# Patient Record
Sex: Female | Born: 1945 | Race: White | Hispanic: No | Marital: Married | Smoking: Former smoker
Health system: Southern US, Community
[De-identification: ages and names within clinical notes are randomized; demographics above are authoritative.]

## PROBLEM LIST (undated history)

## (undated) DIAGNOSIS — Z86010 Personal history of colonic polyps: Secondary | ICD-10-CM

## (undated) DIAGNOSIS — I7300 Raynaud's syndrome without gangrene: Secondary | ICD-10-CM

## (undated) DIAGNOSIS — D126 Benign neoplasm of colon, unspecified: Secondary | ICD-10-CM

## (undated) DIAGNOSIS — I10 Essential (primary) hypertension: Secondary | ICD-10-CM

## (undated) DIAGNOSIS — L930 Discoid lupus erythematosus: Secondary | ICD-10-CM

## (undated) DIAGNOSIS — K219 Gastro-esophageal reflux disease without esophagitis: Secondary | ICD-10-CM

## (undated) DIAGNOSIS — M1990 Unspecified osteoarthritis, unspecified site: Secondary | ICD-10-CM

## (undated) DIAGNOSIS — K589 Irritable bowel syndrome without diarrhea: Secondary | ICD-10-CM

## (undated) DIAGNOSIS — I8390 Asymptomatic varicose veins of unspecified lower extremity: Secondary | ICD-10-CM

## (undated) DIAGNOSIS — T7840XA Allergy, unspecified, initial encounter: Secondary | ICD-10-CM

## (undated) HISTORY — PX: TONSILLECTOMY: SUR1361

## (undated) HISTORY — DX: Raynaud's syndrome without gangrene: I73.00

## (undated) HISTORY — PX: COLONOSCOPY: SHX174

## (undated) HISTORY — DX: Personal history of colonic polyps: Z86.010

## (undated) HISTORY — DX: Gastro-esophageal reflux disease without esophagitis: K21.9

## (undated) HISTORY — DX: Benign neoplasm of colon, unspecified: D12.6

## (undated) HISTORY — DX: Allergy, unspecified, initial encounter: T78.40XA

## (undated) HISTORY — DX: Irritable bowel syndrome without diarrhea: K58.9

## (undated) HISTORY — DX: Essential (primary) hypertension: I10

## (undated) HISTORY — DX: Discoid lupus erythematosus: L93.0

## (undated) HISTORY — DX: Unspecified osteoarthritis, unspecified site: M19.90

## (undated) HISTORY — PX: CHOLECYSTECTOMY: SHX55

## (undated) HISTORY — DX: Asymptomatic varicose veins of unspecified lower extremity: I83.90

---

## 1998-10-04 ENCOUNTER — Other Ambulatory Visit: Admission: RE | Admit: 1998-10-04 | Discharge: 1998-10-04 | Payer: Self-pay | Admitting: Obstetrics & Gynecology

## 1999-04-24 ENCOUNTER — Other Ambulatory Visit: Admission: RE | Admit: 1999-04-24 | Discharge: 1999-04-24 | Payer: Self-pay | Admitting: Obstetrics & Gynecology

## 1999-12-02 ENCOUNTER — Other Ambulatory Visit: Admission: RE | Admit: 1999-12-02 | Discharge: 1999-12-02 | Payer: Self-pay | Admitting: Obstetrics & Gynecology

## 2001-04-29 ENCOUNTER — Other Ambulatory Visit: Admission: RE | Admit: 2001-04-29 | Discharge: 2001-04-29 | Payer: Self-pay | Admitting: Obstetrics & Gynecology

## 2002-06-09 ENCOUNTER — Other Ambulatory Visit: Admission: RE | Admit: 2002-06-09 | Discharge: 2002-06-09 | Payer: Self-pay | Admitting: Obstetrics & Gynecology

## 2002-08-16 ENCOUNTER — Encounter: Admission: RE | Admit: 2002-08-16 | Discharge: 2002-11-14 | Payer: Self-pay | Admitting: Internal Medicine

## 2003-05-03 ENCOUNTER — Encounter: Payer: Self-pay | Admitting: Internal Medicine

## 2003-10-11 ENCOUNTER — Other Ambulatory Visit: Admission: RE | Admit: 2003-10-11 | Discharge: 2003-10-11 | Payer: Self-pay | Admitting: Obstetrics & Gynecology

## 2003-10-26 ENCOUNTER — Encounter: Payer: Self-pay | Admitting: Internal Medicine

## 2003-10-26 DIAGNOSIS — D126 Benign neoplasm of colon, unspecified: Secondary | ICD-10-CM

## 2003-10-26 HISTORY — DX: Benign neoplasm of colon, unspecified: D12.6

## 2003-11-15 ENCOUNTER — Ambulatory Visit: Payer: Self-pay | Admitting: Internal Medicine

## 2003-11-22 ENCOUNTER — Ambulatory Visit: Payer: Self-pay | Admitting: Internal Medicine

## 2003-12-15 ENCOUNTER — Ambulatory Visit: Payer: Self-pay | Admitting: Internal Medicine

## 2003-12-28 ENCOUNTER — Ambulatory Visit: Payer: Self-pay | Admitting: Internal Medicine

## 2004-02-01 ENCOUNTER — Ambulatory Visit: Payer: Self-pay | Admitting: Internal Medicine

## 2004-03-06 ENCOUNTER — Ambulatory Visit: Payer: Self-pay | Admitting: Internal Medicine

## 2004-03-26 ENCOUNTER — Other Ambulatory Visit: Admission: RE | Admit: 2004-03-26 | Discharge: 2004-03-26 | Payer: Self-pay | Admitting: Obstetrics & Gynecology

## 2004-05-22 ENCOUNTER — Ambulatory Visit: Payer: Self-pay | Admitting: Internal Medicine

## 2004-11-25 ENCOUNTER — Ambulatory Visit: Payer: Self-pay | Admitting: Internal Medicine

## 2004-12-23 ENCOUNTER — Ambulatory Visit: Payer: Self-pay | Admitting: Internal Medicine

## 2005-01-29 ENCOUNTER — Ambulatory Visit: Payer: Self-pay | Admitting: Internal Medicine

## 2005-03-24 ENCOUNTER — Ambulatory Visit: Payer: Self-pay | Admitting: Internal Medicine

## 2005-03-31 ENCOUNTER — Ambulatory Visit: Payer: Self-pay | Admitting: Internal Medicine

## 2005-04-29 ENCOUNTER — Ambulatory Visit: Payer: Self-pay | Admitting: Internal Medicine

## 2005-05-13 ENCOUNTER — Ambulatory Visit: Payer: Self-pay | Admitting: Internal Medicine

## 2005-05-20 ENCOUNTER — Ambulatory Visit: Payer: Self-pay | Admitting: Internal Medicine

## 2005-05-29 ENCOUNTER — Ambulatory Visit: Payer: Self-pay | Admitting: Internal Medicine

## 2005-07-02 ENCOUNTER — Ambulatory Visit: Payer: Self-pay | Admitting: Internal Medicine

## 2005-08-01 ENCOUNTER — Ambulatory Visit: Payer: Self-pay | Admitting: Internal Medicine

## 2005-08-26 ENCOUNTER — Ambulatory Visit: Payer: Self-pay | Admitting: Internal Medicine

## 2005-10-01 ENCOUNTER — Ambulatory Visit: Payer: Self-pay | Admitting: Internal Medicine

## 2005-10-28 ENCOUNTER — Ambulatory Visit: Payer: Self-pay | Admitting: Internal Medicine

## 2005-11-26 ENCOUNTER — Ambulatory Visit: Payer: Self-pay | Admitting: Internal Medicine

## 2006-01-26 ENCOUNTER — Ambulatory Visit: Payer: Self-pay | Admitting: Internal Medicine

## 2006-01-26 LAB — CONVERTED CEMR LAB
Albumin: 3.5 g/dL (ref 3.5–5.2)
Alkaline Phosphatase: 50 units/L (ref 39–117)
Hemoglobin: 14.4 g/dL (ref 12.0–15.0)
MCHC: 34.8 g/dL (ref 30.0–36.0)
Monocytes Absolute: 0.5 10*3/uL (ref 0.2–0.7)
Monocytes Relative: 6.4 % (ref 3.0–11.0)
Neutro Abs: 5 10*3/uL (ref 1.4–7.7)
RDW: 11.6 % (ref 11.5–14.6)
Total Bilirubin: 0.8 mg/dL (ref 0.3–1.2)

## 2006-02-27 ENCOUNTER — Ambulatory Visit: Payer: Self-pay | Admitting: Internal Medicine

## 2006-03-26 ENCOUNTER — Ambulatory Visit: Payer: Self-pay | Admitting: Internal Medicine

## 2006-06-16 ENCOUNTER — Ambulatory Visit: Payer: Self-pay | Admitting: Internal Medicine

## 2006-07-07 DIAGNOSIS — K589 Irritable bowel syndrome without diarrhea: Secondary | ICD-10-CM

## 2006-07-07 DIAGNOSIS — K219 Gastro-esophageal reflux disease without esophagitis: Secondary | ICD-10-CM

## 2006-07-07 DIAGNOSIS — I10 Essential (primary) hypertension: Secondary | ICD-10-CM

## 2006-07-07 DIAGNOSIS — M1990 Unspecified osteoarthritis, unspecified site: Secondary | ICD-10-CM

## 2006-07-07 DIAGNOSIS — J309 Allergic rhinitis, unspecified: Secondary | ICD-10-CM | POA: Insufficient documentation

## 2006-07-07 HISTORY — DX: Unspecified osteoarthritis, unspecified site: M19.90

## 2006-07-07 HISTORY — DX: Irritable bowel syndrome, unspecified: K58.9

## 2006-07-20 ENCOUNTER — Ambulatory Visit: Payer: Self-pay | Admitting: Internal Medicine

## 2006-08-26 ENCOUNTER — Ambulatory Visit: Payer: Self-pay | Admitting: Internal Medicine

## 2006-08-26 DIAGNOSIS — D518 Other vitamin B12 deficiency anemias: Secondary | ICD-10-CM

## 2006-08-26 DIAGNOSIS — M810 Age-related osteoporosis without current pathological fracture: Secondary | ICD-10-CM | POA: Insufficient documentation

## 2006-08-26 DIAGNOSIS — L930 Discoid lupus erythematosus: Secondary | ICD-10-CM | POA: Insufficient documentation

## 2006-09-21 ENCOUNTER — Ambulatory Visit: Payer: Self-pay | Admitting: Internal Medicine

## 2006-09-21 LAB — CONVERTED CEMR LAB
AST: 17 units/L (ref 0–37)
Albumin: 3.3 g/dL — ABNORMAL LOW (ref 3.5–5.2)
Alkaline Phosphatase: 50 units/L (ref 39–117)
Anti Nuclear Antibody(ANA): POSITIVE — AB
BUN: 11 mg/dL (ref 6–23)
Basophils Absolute: 0 10*3/uL (ref 0.0–0.1)
Bilirubin Urine: NEGATIVE
Blood in Urine, dipstick: NEGATIVE
Chloride: 113 meq/L — ABNORMAL HIGH (ref 96–112)
Cholesterol: 184 mg/dL (ref 0–200)
Creatinine, Ser: 0.7 mg/dL (ref 0.4–1.2)
Eosinophils Absolute: 0.1 10*3/uL (ref 0.0–0.6)
GFR calc non Af Amer: 90 mL/min
HDL: 55.2 mg/dL (ref >39.0)
Ketones, urine, test strip: NEGATIVE
LDL Cholesterol: 120 mg/dL — ABNORMAL HIGH (ref 0–99)
MCHC: 35.5 g/dL (ref 30.0–36.0)
Monocytes Relative: 5.9 % (ref 3.0–11.0)
Potassium: 4.5 meq/L (ref 3.5–5.1)
Protein, U semiquant: NEGATIVE
RBC: 4.2 M/uL (ref 3.87–5.11)
RDW: 11.8 % (ref 11.5–14.6)
Total Bilirubin: 0.7 mg/dL (ref 0.3–1.2)
Total CHOL/HDL Ratio: 3.3
Triglycerides: 46 mg/dL (ref 0–149)
Urobilinogen, UA: 0.2

## 2006-09-29 ENCOUNTER — Ambulatory Visit: Payer: Self-pay | Admitting: Internal Medicine

## 2006-09-29 DIAGNOSIS — I8390 Asymptomatic varicose veins of unspecified lower extremity: Secondary | ICD-10-CM

## 2006-09-29 HISTORY — DX: Asymptomatic varicose veins of unspecified lower extremity: I83.90

## 2006-11-03 ENCOUNTER — Ambulatory Visit: Payer: Self-pay | Admitting: Internal Medicine

## 2006-11-30 ENCOUNTER — Ambulatory Visit: Payer: Self-pay | Admitting: Internal Medicine

## 2006-12-29 ENCOUNTER — Ambulatory Visit: Payer: Self-pay | Admitting: Internal Medicine

## 2007-01-26 ENCOUNTER — Ambulatory Visit: Payer: Self-pay | Admitting: Internal Medicine

## 2007-01-26 DIAGNOSIS — M67919 Unspecified disorder of synovium and tendon, unspecified shoulder: Secondary | ICD-10-CM | POA: Insufficient documentation

## 2007-01-26 DIAGNOSIS — M719 Bursopathy, unspecified: Secondary | ICD-10-CM

## 2007-01-26 LAB — CONVERTED CEMR LAB
ALT: 16 units/L (ref 0–35)
AST: 18 units/L (ref 0–37)
Alkaline Phosphatase: 51 units/L (ref 39–117)
Basophils Relative: 1.5 % — ABNORMAL HIGH (ref 0.0–1.0)
Bilirubin, Direct: 0.2 mg/dL (ref 0.0–0.3)
Eosinophils Absolute: 0.1 10*3/uL (ref 0.0–0.6)
Eosinophils Relative: 1.3 % (ref 0.0–5.0)
HCT: 41 % (ref 36.0–46.0)
MCV: 95.5 fL (ref 78.0–100.0)
Neutrophils Relative %: 70.4 % (ref 43.0–77.0)
Platelets: 201 10*3/uL (ref 150–400)
RBC: 4.29 M/uL (ref 3.87–5.11)
Total Bilirubin: 0.7 mg/dL (ref 0.3–1.2)
Total Protein: 7.4 g/dL (ref 6.0–8.3)
WBC: 6.5 10*3/uL (ref 4.5–10.5)

## 2007-03-04 ENCOUNTER — Ambulatory Visit: Payer: Self-pay | Admitting: Internal Medicine

## 2007-03-04 DIAGNOSIS — B373 Candidiasis of vulva and vagina: Secondary | ICD-10-CM

## 2007-04-21 ENCOUNTER — Ambulatory Visit: Payer: Self-pay | Admitting: Internal Medicine

## 2007-06-02 ENCOUNTER — Telehealth: Payer: Self-pay | Admitting: Internal Medicine

## 2007-07-05 ENCOUNTER — Ambulatory Visit: Payer: Self-pay | Admitting: Internal Medicine

## 2007-07-14 LAB — CONVERTED CEMR LAB: Pap Smear: NORMAL

## 2007-08-02 ENCOUNTER — Ambulatory Visit: Payer: Self-pay | Admitting: Internal Medicine

## 2007-10-04 ENCOUNTER — Ambulatory Visit: Payer: Self-pay | Admitting: Internal Medicine

## 2007-11-08 ENCOUNTER — Ambulatory Visit: Payer: Self-pay | Admitting: Internal Medicine

## 2007-11-25 ENCOUNTER — Ambulatory Visit: Payer: Self-pay | Admitting: Internal Medicine

## 2007-11-25 LAB — CONVERTED CEMR LAB
ALT: 16 units/L (ref 0–35)
Albumin: 3.4 g/dL — ABNORMAL LOW (ref 3.5–5.2)
Alkaline Phosphatase: 52 units/L (ref 39–117)
BUN: 8 mg/dL (ref 6–23)
Bilirubin, Direct: 0.2 mg/dL (ref 0.0–0.3)
CO2: 29 meq/L (ref 19–32)
Calcium: 9 mg/dL (ref 8.4–10.5)
Eosinophils Relative: 2.1 % (ref 0.0–5.0)
GFR calc Af Amer: 93 mL/min
Glucose, Bld: 87 mg/dL (ref 70–99)
HCT: 39.8 % (ref 36.0–46.0)
Hemoglobin: 14 g/dL (ref 12.0–15.0)
LDL Cholesterol: 114 mg/dL — ABNORMAL HIGH (ref 0–99)
Lymphocytes Relative: 30.3 % (ref 12.0–46.0)
Monocytes Absolute: 0.3 10*3/uL (ref 0.1–1.0)
Monocytes Relative: 8.3 % (ref 3.0–12.0)
Neutro Abs: 2.2 10*3/uL (ref 1.4–7.7)
Nitrite: NEGATIVE
Potassium: 4.4 meq/L (ref 3.5–5.1)
RDW: 11.7 % (ref 11.5–14.6)
Sodium: 140 meq/L (ref 135–145)
Specific Gravity, Urine: 1.02
Total CHOL/HDL Ratio: 3
Total Protein: 7.2 g/dL (ref 6.0–8.3)
Triglycerides: 50 mg/dL (ref 0–149)
Urobilinogen, UA: 0.2
WBC Urine, dipstick: NEGATIVE
WBC: 3.7 10*3/uL — ABNORMAL LOW (ref 4.5–10.5)

## 2007-12-02 ENCOUNTER — Ambulatory Visit: Payer: Self-pay | Admitting: Internal Medicine

## 2007-12-30 ENCOUNTER — Ambulatory Visit: Payer: Self-pay | Admitting: Internal Medicine

## 2007-12-31 ENCOUNTER — Telehealth: Payer: Self-pay | Admitting: Internal Medicine

## 2008-02-01 ENCOUNTER — Telehealth: Payer: Self-pay | Admitting: Internal Medicine

## 2008-02-07 ENCOUNTER — Ambulatory Visit: Payer: Self-pay | Admitting: Internal Medicine

## 2008-02-07 DIAGNOSIS — K117 Disturbances of salivary secretion: Secondary | ICD-10-CM

## 2008-03-29 ENCOUNTER — Ambulatory Visit: Payer: Self-pay | Admitting: Internal Medicine

## 2008-04-05 ENCOUNTER — Encounter: Payer: Self-pay | Admitting: Internal Medicine

## 2008-04-18 ENCOUNTER — Encounter: Admission: RE | Admit: 2008-04-18 | Discharge: 2008-04-18 | Payer: Self-pay | Admitting: Internal Medicine

## 2008-04-26 ENCOUNTER — Telehealth: Payer: Self-pay | Admitting: Internal Medicine

## 2008-04-28 ENCOUNTER — Ambulatory Visit: Payer: Self-pay | Admitting: Internal Medicine

## 2008-05-03 ENCOUNTER — Encounter: Admission: RE | Admit: 2008-05-03 | Discharge: 2008-05-03 | Payer: Self-pay | Admitting: Internal Medicine

## 2008-05-08 ENCOUNTER — Telehealth: Payer: Self-pay | Admitting: Speech Pathology

## 2008-05-10 ENCOUNTER — Encounter (INDEPENDENT_AMBULATORY_CARE_PROVIDER_SITE_OTHER): Payer: Self-pay | Admitting: Interventional Radiology

## 2008-05-10 ENCOUNTER — Other Ambulatory Visit: Admission: RE | Admit: 2008-05-10 | Discharge: 2008-05-10 | Payer: Self-pay | Admitting: Interventional Radiology

## 2008-05-10 ENCOUNTER — Encounter: Admission: RE | Admit: 2008-05-10 | Discharge: 2008-05-10 | Payer: Self-pay | Admitting: Internal Medicine

## 2008-05-17 ENCOUNTER — Telehealth: Payer: Self-pay | Admitting: Internal Medicine

## 2008-06-06 ENCOUNTER — Ambulatory Visit: Payer: Self-pay | Admitting: Internal Medicine

## 2008-06-13 ENCOUNTER — Telehealth: Payer: Self-pay | Admitting: Internal Medicine

## 2008-07-31 ENCOUNTER — Ambulatory Visit: Payer: Self-pay | Admitting: Internal Medicine

## 2008-08-08 ENCOUNTER — Encounter: Admission: RE | Admit: 2008-08-08 | Discharge: 2008-08-08 | Payer: Self-pay | Admitting: Interventional Radiology

## 2008-09-05 ENCOUNTER — Ambulatory Visit: Payer: Self-pay | Admitting: Internal Medicine

## 2008-10-11 ENCOUNTER — Ambulatory Visit: Payer: Self-pay | Admitting: Internal Medicine

## 2008-11-07 ENCOUNTER — Encounter: Admission: RE | Admit: 2008-11-07 | Discharge: 2008-11-07 | Payer: Self-pay | Admitting: Interventional Radiology

## 2008-12-04 ENCOUNTER — Ambulatory Visit: Payer: Self-pay | Admitting: Internal Medicine

## 2008-12-20 ENCOUNTER — Encounter: Admission: RE | Admit: 2008-12-20 | Discharge: 2008-12-20 | Payer: Self-pay | Admitting: Interventional Radiology

## 2009-01-10 ENCOUNTER — Ambulatory Visit: Payer: Self-pay | Admitting: Internal Medicine

## 2009-01-22 ENCOUNTER — Ambulatory Visit: Payer: Self-pay | Admitting: Internal Medicine

## 2009-01-22 LAB — CONVERTED CEMR LAB
ALT: 16 units/L (ref 0–35)
Alkaline Phosphatase: 52 units/L (ref 39–117)
Anti Nuclear Antibody(ANA): POSITIVE — AB
BUN: 9 mg/dL (ref 6–23)
Bilirubin Urine: NEGATIVE
Bilirubin, Direct: 0 mg/dL (ref 0.0–0.3)
Blood in Urine, dipstick: NEGATIVE
Calcium: 9 mg/dL (ref 8.4–10.5)
Chloride: 106 meq/L (ref 96–112)
Cholesterol: 212 mg/dL — ABNORMAL HIGH (ref 0–200)
Creatinine, Ser: 0.9 mg/dL (ref 0.4–1.2)
Direct LDL: 125.6 mg/dL
Eosinophils Relative: 2.2 % (ref 0.0–5.0)
GFR calc non Af Amer: 66.78 mL/min (ref >60)
Glucose, Urine, Semiquant: NEGATIVE
HDL: 74.1 mg/dL (ref >39.00)
Lymphocytes Relative: 28.1 % (ref 12.0–46.0)
MCV: 95.5 fL (ref 78.0–100.0)
Monocytes Absolute: 0.4 10*3/uL (ref 0.1–1.0)
Neutrophils Relative %: 61.3 % (ref 43.0–77.0)
Platelets: 192 10*3/uL (ref 150.0–400.0)
Protein, U semiquant: NEGATIVE
Total Bilirubin: 0.7 mg/dL (ref 0.3–1.2)
Total CHOL/HDL Ratio: 3
Triglycerides: 53 mg/dL (ref 0.0–149.0)
Urobilinogen, UA: 0.2
VLDL: 10.6 mg/dL (ref 0.0–40.0)
WBC: 5 10*3/uL (ref 4.5–10.5)
pH: 7

## 2009-01-23 ENCOUNTER — Encounter: Admission: RE | Admit: 2009-01-23 | Discharge: 2009-01-23 | Payer: Self-pay | Admitting: Interventional Radiology

## 2009-01-29 ENCOUNTER — Ambulatory Visit: Payer: Self-pay | Admitting: Internal Medicine

## 2009-01-30 ENCOUNTER — Encounter: Admission: RE | Admit: 2009-01-30 | Discharge: 2009-01-30 | Payer: Self-pay | Admitting: Interventional Radiology

## 2009-02-07 ENCOUNTER — Ambulatory Visit: Payer: Self-pay | Admitting: Internal Medicine

## 2009-02-27 ENCOUNTER — Encounter: Admission: RE | Admit: 2009-02-27 | Discharge: 2009-02-27 | Payer: Self-pay | Admitting: Interventional Radiology

## 2009-03-06 ENCOUNTER — Ambulatory Visit: Payer: Self-pay | Admitting: Internal Medicine

## 2009-03-28 LAB — HM PAP SMEAR

## 2009-03-30 ENCOUNTER — Ambulatory Visit: Payer: Self-pay | Admitting: Internal Medicine

## 2009-04-30 ENCOUNTER — Ambulatory Visit: Payer: Self-pay | Admitting: Internal Medicine

## 2009-05-01 ENCOUNTER — Encounter (INDEPENDENT_AMBULATORY_CARE_PROVIDER_SITE_OTHER): Payer: Self-pay | Admitting: *Deleted

## 2009-05-09 ENCOUNTER — Telehealth: Payer: Self-pay | Admitting: Internal Medicine

## 2009-05-15 ENCOUNTER — Encounter (INDEPENDENT_AMBULATORY_CARE_PROVIDER_SITE_OTHER): Payer: Self-pay

## 2009-05-17 ENCOUNTER — Ambulatory Visit: Payer: Self-pay | Admitting: Internal Medicine

## 2009-05-21 DIAGNOSIS — Z860101 Personal history of adenomatous and serrated colon polyps: Secondary | ICD-10-CM

## 2009-05-21 DIAGNOSIS — Z86010 Personal history of colonic polyps: Secondary | ICD-10-CM

## 2009-05-21 HISTORY — DX: Personal history of colonic polyps: Z86.010

## 2009-05-21 HISTORY — DX: Personal history of adenomatous and serrated colon polyps: Z86.0101

## 2009-05-30 ENCOUNTER — Ambulatory Visit: Payer: Self-pay | Admitting: Internal Medicine

## 2009-05-31 ENCOUNTER — Ambulatory Visit: Payer: Self-pay | Admitting: Internal Medicine

## 2009-06-07 ENCOUNTER — Encounter: Payer: Self-pay | Admitting: Internal Medicine

## 2009-06-29 ENCOUNTER — Ambulatory Visit: Payer: Self-pay | Admitting: Internal Medicine

## 2009-07-10 ENCOUNTER — Ambulatory Visit: Payer: Self-pay | Admitting: Internal Medicine

## 2009-08-08 ENCOUNTER — Encounter: Admission: RE | Admit: 2009-08-08 | Discharge: 2009-08-08 | Payer: Self-pay | Admitting: Interventional Radiology

## 2009-08-08 ENCOUNTER — Ambulatory Visit: Payer: Self-pay | Admitting: Internal Medicine

## 2009-08-28 ENCOUNTER — Telehealth: Payer: Self-pay | Admitting: Internal Medicine

## 2009-09-14 ENCOUNTER — Ambulatory Visit: Payer: Self-pay | Admitting: Internal Medicine

## 2009-11-09 ENCOUNTER — Ambulatory Visit: Payer: Self-pay | Admitting: Internal Medicine

## 2009-11-09 LAB — CONVERTED CEMR LAB
BUN: 13 mg/dL (ref 6–23)
Calcium: 8.9 mg/dL (ref 8.4–10.5)
Chloride: 103 meq/L (ref 96–112)
Creatinine, Ser: 0.8 mg/dL (ref 0.4–1.2)
TSH: 1.27 microintl units/mL (ref 0.35–5.50)

## 2009-11-14 ENCOUNTER — Encounter: Admission: RE | Admit: 2009-11-14 | Discharge: 2009-11-14 | Payer: Self-pay | Admitting: Interventional Radiology

## 2009-12-14 ENCOUNTER — Ambulatory Visit: Payer: Self-pay | Admitting: Internal Medicine

## 2010-01-02 ENCOUNTER — Telehealth: Payer: Self-pay | Admitting: Internal Medicine

## 2010-01-04 ENCOUNTER — Ambulatory Visit: Payer: Self-pay | Admitting: Internal Medicine

## 2010-01-28 ENCOUNTER — Ambulatory Visit: Admit: 2010-01-28 | Payer: Self-pay | Admitting: Internal Medicine

## 2010-02-03 ENCOUNTER — Encounter: Payer: Self-pay | Admitting: Diagnostic Radiology

## 2010-02-03 ENCOUNTER — Encounter: Payer: Self-pay | Admitting: Interventional Radiology

## 2010-02-07 ENCOUNTER — Ambulatory Visit (INDEPENDENT_AMBULATORY_CARE_PROVIDER_SITE_OTHER)
Admission: RE | Admit: 2010-02-07 | Discharge: 2010-02-07 | Payer: BC Managed Care – PPO | Source: Home / Self Care | Attending: Internal Medicine | Admitting: Internal Medicine

## 2010-02-12 NOTE — Assessment & Plan Note (Signed)
Summary: B-12//ALP  Nurse Visit   Allergies: 1)  ! Sulfa 2)  ! Wellbutrin  Medication Administration  Injection # 1:    Medication: Vit B12 1000 mcg    Diagnosis: ANEMIA, B12 DEFICIENCY (ICD-281.1)    Route: IM    Site: R deltoid    Exp Date: 10/11/2010    Lot #: [id number]    Mfr: [employer]    Patient tolerated injection without complications    Given by: Willy Eddy, LPN (June 29, 2009 12:33 PM)  Orders Added: 1)  Vit B12 1000 mcg [J3420] 2)  Admin of Therapeutic Inj  intramuscular or subcutaneous [40973]

## 2010-02-12 NOTE — Assessment & Plan Note (Signed)
Summary: 3 MONTH ROV/NJR   Vital Signs:  Patient profile:   65 year old female Height:      65 inches Weight:      188 pounds BMI:     31.40 Temp:     98.2 degrees F oral Pulse rate:   72 / minute Resp:     14 per minute BP sitting:   110 / 79  (left arm)  Vitals Entered By: Willy Eddy, LPN (April 30, 2009 11:43 AM) CC: roa, Hypertension Management   CC:  roa and Hypertension Management.  History of Present Illness: weight has increased has been stress eating due to loss of best friend "self medicating with food" has realization   Hypertension History:      She denies headache, chest pain, palpitations, dyspnea with exertion, orthopnea, PND, peripheral edema, visual symptoms, neurologic problems, syncope, and side effects from treatment.        Positive major cardiovascular risk factors include female age 19 years old or older and hypertension.  Negative major cardiovascular risk factors include non-tobacco-user status.     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) 11)  Anemia, B12 Deficiency  (ICD-281.1) 12)  Degenerative Joint Disease  (ICD-715.90) 13)  Irritable Bowel Syndrome  (ICD-564.1) 14)  Hypertension  (ICD-401.9) 15)  Allergic Rhinitis  (ICD-477.9) 16)  Gerd  (ICD-530.81)  Current Problems (verified): 1)  Neck Mass  (ICD-784.2) 2)  Disturbance of Salivary Secretion  (ICD-527.7) 3)  Candidiasis of Vulva and Vagina  (ICD-112.1) 4)   Subacromial Bursitis  (ICD-726.10) 5)  Pain in Joint, Upper Arm  (ICD-719.42) 6)  Varicose Veins, Lower Extremities  (ICD-454.9) 7)  Physical Examination  (ICD-V70.0) 8)  Osteoporosis  (ICD-733.00) 9)  Disorder, 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 Once Daily 2)  Carvedilol 6.25 Mg Tabs (Carvedilol) .Marland Kitchen.. 1 Two Times A Day 3)  Estrace 2 Mg Tabs (Estradiol) .Marland Kitchen.. 1 Once Daily 4)  Nexium 40 Mg  Cpdr (Esomeprazole Magnesium) .... Once Daily 5)  Vitamin B-12 Cr 1500 Mcg  Tbcr (Cyanocobalamin) .... Monthly 6)  Asa 81mg  .... Once Daily 7)  Therapeutic Multivitamin   Tabs (Multiple Vitamin) .... Once Daily 8)  Calcium .Marland KitchenMarland Kitchen. 1500 Every Day 9)  Vitamin D .... 1600 International Units Every Day 10)  Vitamin E 400 .... Once Daily 11)  Hydroxyzine Hcl 25 Mg/ml  Soln (Hydroxyzine Hcl) .Marland Kitchen.. 1-2 Once Daily 12)  Prometrium 200 Mg Caps (Progesterone Micronized) .Marland Kitchen.. 1- 12 Days During Cycle 13)  Biotene Oralbalance Dry Mouth  Liqd (Artificial Saliva)  Current Medications (verified): 1)  Plaquenil 200 Mg  Tabs (Hydroxychloroquine Sulfate) .... Once Daily 2)  Carvedilol 6.25 Mg  Tabs (Carvedilol) .Marland Kitchen.. 1 Two Times A Day 3)  Estrace 2 Mg Tabs (Estradiol) .Marland Kitchen.. 1 Once Daily 4)  Nexium 40 Mg  Cpdr (Esomeprazole Magnesium) .... Once Daily 5)  Vitamin B-12 Cr 1500 Mcg  Tbcr (Cyanocobalamin) .... Monthly 6)  Asa 81mg  .... Once Daily 7)  Therapeutic Multivitamin   Tabs (Multiple Vitamin) .... Once Daily 8)  Calcium .Marland KitchenMarland Kitchen. 1500 Every Day 9)  Vitamin D .... 1600 International Units Every Day 10)  Vitamin E 400 .... Once Daily 11)  Hydroxyzine Hcl  25 Mg/ml  Soln (Hydroxyzine Hcl) .Marland Kitchen.. 1-2 Once Daily 12)  Prometrium 200 Mg Caps (Progesterone Micronized) .Marland Kitchen.. 1- 12 Days During Cycle 13)  Biotene Oralbalance Dry Mouth  Liqd (Artificial Saliva)  Allergies (verified): 1)  ! Sulfa 2)  ! Wellbutrin  Past History:  Family History: Last updated: 08/26/2006 father Family History Lung cancer mother Family History of Arthritis Family History Osteoporosis  Social History: Last updated: 08/26/2006 Alcohol use-yes-sociallt Former Smoker Married  Risk Factors: Smoking Status: quit (04/30/2009)  Past medical, surgical, family and social histories (including risk factors) reviewed, and no changes noted (except as noted below).  Past Medical History: Reviewed history from 08/26/2006 and no changes required. GERD Allergic rhinitis Hypertension raynauds discoid sle Osteoporosis  Past Surgical History: Reviewed history from 08/26/2006 and no changes required. Cholecystectomy Tonsillectomy  Family History: Reviewed history from 08/26/2006 and no changes required. father Family History Lung cancer mother Family History of Arthritis Family History Osteoporosis  Social History: Reviewed history from 08/26/2006 and no changes required. Alcohol use-yes-sociallt Former Smoker Married  Review of Systems       The patient complains of weight gain.  The patient denies anorexia, fever, weight loss, vision loss, decreased hearing, hoarseness, chest pain, syncope, dyspnea on exertion, peripheral edema, prolonged cough, headaches, hemoptysis, abdominal pain, melena, hematochezia, severe indigestion/heartburn, hematuria, incontinence, genital sores, muscle weakness, suspicious skin lesions, transient blindness, difficulty walking, depression, unusual weight change, abnormal bleeding, enlarged lymph nodes, angioedema, breast masses, and testicular masses.    Physical Exam  General:  Well-developed,well-nourished,in no acute distress;  alert,appropriate and cooperative throughout examination Head:  Normocephalic and atraumatic without obvious abnormalities. No apparent alopecia or balding. Ears:  R ear normal and L ear normal.   Nose:  no external deformity and no nasal discharge.   Mouth:  dry mucosa and veins noted o the dorsum of the tongue Neck:  No deformities, masses, or tenderness noted. Lungs:  normal respiratory effort and no wheezes.   Heart:  normal rate and no lifts.   Abdomen:  Bowel sounds positive,abdomen soft and non-tender without masses, organomegaly or hernias noted. Msk:  normal ROM, no joint tenderness, and no joint swelling.   Extremities:  trace left pedal edema and trace right pedal edema.  wearing the hose  Neurologic:  alert & oriented X3 and gait normal.     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 Once daily  Problem # 2:  HYPERTENSION (ICD-401.9)  Her updated medication list for this problem includes:    Carvedilol 6.25 Mg Tabs (Carvedilol) .Marland Kitchen... 1 two times a day  BP today: 110/79 Prior BP: 130/80 (01/29/2009)  10 Yr Risk Heart Disease: 4 % Prior 10 Yr Risk Heart Disease: 7 % (03/29/2008)  Labs Reviewed: K+: 4.3 (01/22/2009) Creat: : 0.9 (01/22/2009)   Chol: 212 (01/22/2009)  HDL: 74.10 (01/22/2009)   LDL: 114 (11/25/2007)   TG: 53.0 (01/22/2009)  Problem # 3:  PALPITATIONS, OCCASIONAL (ICD-785.1) e pt has risk for PAT and she ned to loos weight consider event moniter if the HR acelerations continue Her updated medication list for this problem includes:    Carvedilol 6.25 Mg Tabs (Carvedilol) .Marland Kitchen... 1 two times a day  Avoid caffeine, chocolate, and stimulants. Stress reduction as well as medication options discussed.   Problem # 4:  WEIGHT GAIN (ICD-783.1) suspect that hypo and hypergycemia due to  the weight  Complete Medication List: 1)  Plaquenil 200 Mg Tabs (Hydroxychloroquine sulfate) .... Once daily 2)  Carvedilol 6.25 Mg Tabs (Carvedilol) .Marland Kitchen.. 1 two times a day 3)  Estrace 2 Mg Tabs (Estradiol) .Marland Kitchen.. 1 once daily 4)  Nexium 40 Mg Cpdr (Esomeprazole magnesium) .... Once daily 5)  Vitamin B-12 Cr 1500 Mcg Tbcr (Cyanocobalamin) .... Monthly 6)  Asa 81mg   .... Once daily 7)  Therapeutic Multivitamin Tabs (Multiple vitamin) .... Once daily 8)  Calcium  .Marland KitchenMarland Kitchen. 1500 every day 9)  Vitamin D  .... 1600 international units every day 10)  Vitamin E 400  .... Once daily 11)  Hydroxyzine Hcl 25 Mg/ml Soln (Hydroxyzine hcl) .Marland Kitchen.. 1-2 once daily 12)  Prometrium 200 Mg Caps (Progesterone micronized) .Marland Kitchen.. 1- 12 days during cycle 13)  Biotene Oralbalance Dry Mouth Liqd (Artificial saliva)  Other Orders: Vit B12 1000 mcg (J3420) Admin of Therapeutic Inj  intramuscular or subcutaneous ([address]) Gastroenterology Referral (GI)  Hypertension Assessment/Plan:      The patient's hypertensive risk group is category B: At least one risk factor (excluding diabetes) with no target organ damage.  Her calculated 10 year risk of coronary heart disease is 4 %.  Today's blood pressure is 110/79.  Her blood pressure goal is < 140/90.  Patient Instructions: 1)  Low card and low sugar diet 2)  monter HR and if the palpitations increase call for a hoilter of if they sustain... come in for an EKG 3)  Please schedule a follow-up appointment in 2 months. 4)  will call with colon if needed Prescriptions: NEXIUM 40 MG  CPDR (ESOMEPRAZOLE MAGNESIUM) once daily  #90 x 3   Entered by:   Willy Eddy, LPN   Authorized by:   Stacie Glaze MD   Signed by:   Willy Eddy, LPN on 60/45/4098   Method used:   Print then Give to Patient   RxID:   [id number] CARVEDILOL 6.25 MG TABS (CARVEDILOL) 1 two times a day  #180 x 3   Entered by:   Willy Eddy, LPN   Authorized by:   Stacie Glaze MD   Signed  by:   Willy Eddy, LPN on 65/78/4696   Method used:   Print then Give to Patient   RxID:   [id number]    Medication Administration  Injection # 1:    Medication: Vit B12 1000 mcg    Diagnosis: ANEMIA, B12 DEFICIENCY (ICD-281.1)    Route: IM    Site: L deltoid    Exp Date: 10/12/2010    Lot #: [id number]    Mfr: [employer]    Patient tolerated injection without complications    Given by: Willy Eddy, LPN (April 30, 2009 11:45 AM)  Orders Added: 1)  Vit B12 1000 mcg [J3420] 2)  Admin of Therapeutic Inj  intramuscular or subcutaneous [96372] 3)  Est. Patient Level IV [25366] 4)  Gastroenterology Referral [  GI]

## 2010-02-12 NOTE — Assessment & Plan Note (Signed)
Summary: b12 inj/njr  Nurse Visit   Allergies: 1)  ! Sulfa 2)  ! Wellbutrin  Medication Administration  Injection # 1:    Medication: Vit B12 1000 mcg    Diagnosis: ANEMIA, B12 DEFICIENCY (ICD-281.1)    Site: L deltoid    Exp Date: 08/13/2011    Lot #: [id number]    Mfr: [employer]    Comments: 1.60ml/1500mcg given    Patient tolerated injection without complications    Given by: Willy Eddy, LPN (December 14, 2009 3:13 PM)  Orders Added: 1)  Vit B12 1000 mcg [J3420] 2)  Admin of Therapeutic Inj  intramuscular or subcutaneous [[address]]

## 2010-02-12 NOTE — Assessment & Plan Note (Signed)
Summary: B-12/CJR  Nurse Visit   Allergies: 1)  ! Sulfa 2)  ! Wellbutrin  Medication Administration  Injection # 1:    Medication: Vit B12 1000 mcg    Diagnosis: ANEMIA, B12 DEFICIENCY (ICD-281.1)    Route: IM    Site: LUOQ gluteus    Exp Date: 10/12/2010    Lot #: [id number]    Mfr: [employer]    Comments: 1.5/1500mcg given    Patient tolerated injection without complications    Given by: Willy Eddy, LPN (May 30, 2009 12:15 PM)  Orders Added: 1)  Vit B12 1000 mcg [J3420] 2)  Admin of Therapeutic Inj  intramuscular or subcutaneous [60454]

## 2010-02-12 NOTE — Procedures (Signed)
Summary: Colonoscopy  Patient: Kelly Robles Note: All result statuses are Final unless otherwise noted.  Tests: (1) Colonoscopy (COL)   COL Colonoscopy           DONE     [facility]     [address]     [place], [place]  [address]           COLONOSCOPY PROCEDURE REPORT           PATIENT:  Kelly Robles, Kelly Robles  MR#:  [id number]     BIRTHDATE:  Feb 25, 1945, 63 yrs. old  GENDER:  female     ENDOSCOPIST:  Iva Boop, MD, [facility]           PROCEDURE DATE:  05/31/2009     PROCEDURE:  Colonoscopy with snare polypectomy     ASA CLASS:  Class II     INDICATIONS:  Routine Risk Screening     MEDICATIONS:   Fentanyl 75 mcg IV, Versed 6 mg IV           DESCRIPTION OF PROCEDURE:   After the risks benefits and     alternatives of the procedure were thoroughly explained, informed     consent was obtained.  Digital rectal exam was performed and     revealed no abnormalities.   The LB CF-[id number] endoscope     was introduced through the anus and advanced to the cecum, which     was identified by both the appendix and ileocecal valve, without     limitations.  The quality of the prep was excellent, using     MoviPrep.  The instrument was then slowly withdrawn as the colon     was fully examined. Insertion: 6:58 minutes Withdrawal: 7:56     minutes     <<PROCEDUREIMAGES>>           FINDINGS:  A diminutive polyp was found in the ascending colon. It     was 5 mm in size. Polyp was snared without cautery. Retrieval was     successful. snare polyp  Moderate diverticulosis was found in the     sigmoid colon.  This was otherwise a normal examination of the     colon.   Retroflexed views in the rectum revealed no     abnormalities.    The scope was then withdrawn from the patient     and the procedure completed.           COMPLICATIONS:  None     ENDOSCOPIC IMPRESSION:     1) 5 mm diminutive polyp in the ascending colon - removed     2) Moderate diverticulosis in the sigmoid  colon     3) Otherwise normal examination, excellent prep           REPEAT EXAM:  In for Colonoscopy, pending biopsy results.           Iva Boop, MD, [facility]           CC:  Stacie Glaze, MD     The Patient           n.     Rosalie Doctor:   Iva Boop at 05/31/2009 12:57 PM           Kulaga, Santina Evans, [id number]  Note: An exclamation mark (!) indicates a result that was not dispersed into the flowsheet. Document Creation Date: 05/31/2009 5:08 PM _______________________________________________________________________  (1) Order result status: Final Collection or observation date-time: 05/31/2009  12:49 Requested date-time:  Receipt date-time:  Reported date-time:  Referring Physician:   Ordering Physician: Stan Head [id number]) Specimen Source:  Source: Launa Grill Order Number: [id number] Lab site:   Appended Document: Colonoscopy     Procedures Next Due Date:    Colonoscopy: 06/2014

## 2010-02-12 NOTE — Letter (Signed)
Summary: Previsit letter  [facility]  [address] [place], [place] [address]   Phone: [phone]  Fax: [phone]       05/01/2009 MRN: [id number]  [facility] [place] [address] [place], [place]  [address]  Dear Ms. Lascola,  Welcome to the Gastroenterology Division at [facility].    You are scheduled to see a nurse for your pre-procedure visit on 05-17-09 at 11:00a.m. on the 3rd floor at [facility], [address]  We ask that you try to arrive at our office 15 minutes prior to your appointment time to allow for check-in.  Your nurse visit will consist of discussing your medical and surgical history, your immediate family medical history, and your medications.    Please bring a complete list of all your medications or, if you prefer, bring the medication bottles and we will list them.  We will need to be aware of both prescribed and over the counter drugs.  We will need to know exact dosage information as well.  If you are on blood thinners (Coumadin, Plavix, Aggrenox, Ticlid, etc.) please call our office today/prior to your appointment, as we need to consult with your physician about holding your medication.   Please be prepared to read and sign documents such as consent forms, a financial agreement, and acknowledgement forms.  If necessary, and with your consent, a friend or relative is welcome to sit-in on the nurse visit with you.  Please bring your insurance card so that we may make a copy of it.  If your insurance requires a referral to see a specialist, please bring your referral form from your primary care physician.  No co-pay is required for this nurse visit.     If you cannot keep your appointment, please call [phone] to cancel or reschedule prior to your appointment date.  This allows [place] the opportunity to schedule an appointment for another patient in need of care.    Thank you for choosing [facility] for your medical  needs.  We appreciate the opportunity to care for you.  Please visit [place] at our website  to learn more about our practice.                     Sincerely.                                                                                                                   The Gastroenterology Division

## 2010-02-12 NOTE — Assessment & Plan Note (Signed)
Summary: B12//CCM  Nurse Visit   Allergies: 1)  ! Sulfa 2)  ! Wellbutrin  Medication Administration  Injection # 1:    Medication: Vit B12 1000 mcg    Diagnosis: ANEMIA, B12 DEFICIENCY (ICD-281.1)    Route: IM    Site: L deltoid    Exp Date: 09/13/2010    Lot #: [id number]    Mfr: [employer]    Comments: 1.58ml/1500mcg given    Patient tolerated injection without complications    Given by: Willy Eddy, LPN (March 06, 2009 12:23 PM)  Orders Added: 1)  Vit B12 1000 mcg [J3420] 2)  Admin of Therapeutic Inj  intramuscular or subcutaneous [[address]]

## 2010-02-12 NOTE — Assessment & Plan Note (Signed)
Summary: B-12 INJ WITH BONNYE/CJR  Nurse Visit   Allergies: 1)  ! Sulfa 2)  ! Wellbutrin  Medication Administration  Injection # 1:    Medication: Vit B12 1000 mcg    Diagnosis: ANEMIA, B12 DEFICIENCY (ICD-281.1)    Route: IM    Site: L deltoid    Exp Date: 04/25/2009    Lot #: [id number]    Mfr: [employer]    Patient tolerated injection without complications    Given by: Kern Reap CMA (AAMA) (September 14, 2009 12:30 PM)  Orders Added: 1)  Vit B12 1000 mcg [J3420] 2)  Admin of Therapeutic Inj  intramuscular or subcutaneous [96372] 3)  Admin of Therapeutic Inj  intramuscular or subcutaneous [28413]

## 2010-02-12 NOTE — Assessment & Plan Note (Signed)
Summary: 2 month rov/njr/pt rsc/cjr   Vital Signs:  Patient profile:   65 year old female Height:      65 inches Weight:      180 pounds BMI:     30.06 Temp:     98.2 degrees F oral Pulse rate:   72 / minute Resp:     14 per minute BP sitting:   120 / 70  (left arm)  Vitals Entered By: Willy Eddy, LPN (July 10, 2009 11:58 AM) CC: roa, Hypertension Management   CC:  roa and Hypertension Management.  History of Present Illness: has a polyp and will need colon in 5 years lost 8 pounds has been working at [employer]  Hypertension History:      She denies headache, chest pain, palpitations, dyspnea with exertion, orthopnea, PND, peripheral edema, visual symptoms, neurologic problems, syncope, and side effects from treatment.        Positive major cardiovascular risk factors include female age 35 years old or older and hypertension.  Negative major cardiovascular risk factors include non-tobacco-user status.     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) 13)  Lupus Erythematosus, 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) 14)  Anemia, B12 Deficiency  (ICD-281.1) 15)  Degenerative Joint Disease  (ICD-715.90) 16)  Irritable Bowel Syndrome  (ICD-564.1) 17)  Hypertension  (ICD-401.9) 18)  Allergic Rhinitis  (ICD-477.9) 19)  Gerd  (ICD-530.81)  Current Problems (verified): 1)  Colonic Polyps, Adenomatous   (ICD-211.3) 2)  Weight Gain  (ICD-783.1) 3)  Palpitations, Occasional  (ICD-785.1) 4)  Neck Mass  (ICD-784.2) 5)  Disturbance of Salivary Secretion  (ICD-527.7) 6)  Candidiasis of Vulva and Vagina  (ICD-112.1) 7)  Subacromial Bursitis  (ICD-726.10) 8)  Pain in Joint, Upper Arm  (ICD-719.42) 9)  Varicose Veins, Lower Extremities  (ICD-454.9) 10)  Physical Examination  (ICD-V70.0) 11)  Osteoporosis  (ICD-733.00) 12)  Disorder, 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 Once Daily 2)  Carvedilol 6.25 Mg Tabs (Carvedilol) .Marland Kitchen.. 1 Two Times A Day 3)  Estrace 2 Mg Tabs (Estradiol) .Marland Kitchen.. 1 Once Daily 4)  Nexium 40 Mg  Cpdr (Esomeprazole Magnesium) .... Once Daily 5)  Vitamin B-12 Cr 1500 Mcg  Tbcr (Cyanocobalamin) .... Monthly 6)  Asa 81mg  .... Once Daily 7)  Therapeutic Multivitamin   Tabs (Multiple Vitamin) .... Once Daily 8)  Calcium .Marland KitchenMarland Kitchen. 1500 Every Day 9)  Vitamin D .... 1600 International Units Every Day 10)  Vitamin E 400 .... Once Daily 11)  Hydroxyzine Hcl 25 Mg/ml  Soln (Hydroxyzine Hcl) .Marland Kitchen.. 1-2 Once Daily 12)  Prometrium 200 Mg Caps (Progesterone  Micronized) .Marland Kitchen.. 1- 12 Days During Cycle 13)  Biotene Oralbalance Dry Mouth  Liqd (Artificial Saliva)  Current Medications (verified): 1)  Plaquenil 200 Mg  Tabs (Hydroxychloroquine Sulfate) .... Once Daily 2)  Carvedilol 6.25 Mg Tabs (Carvedilol) .Marland Kitchen.. 1 Two Times A Day 3)  Estrace 2 Mg Tabs (Estradiol) .Marland Kitchen.. 1 Once Daily 4)  Nexium 40 Mg  Cpdr (Esomeprazole Magnesium) .... Once Daily 5)  Vitamin B-12 Cr 1500 Mcg  Tbcr (Cyanocobalamin) .... Monthly 6)  Asa 81mg  .... Once  Daily 7)  Therapeutic Multivitamin   Tabs (Multiple Vitamin) .... Once Daily 8)  Calcium .Marland KitchenMarland Kitchen. 1500 Every Day 9)  Vitamin D .... 1600 International Units Every Day 10)  Vitamin E 400 .... Once Daily 11)  Hydroxyzine Hcl 25 Mg/ml  Soln (Hydroxyzine Hcl) .Marland Kitchen.. 1-2 Once Daily 12)  Prometrium 200 Mg Caps (Progesterone Micronized) .Marland Kitchen.. 1- 12 Days During Cycle 13)  Biotene Oralbalance Dry Mouth  Liqd (Artificial Saliva)  Allergies (verified): 1)  ! Sulfa 2)  ! Wellbutrin  Past History:  Family History: Last updated: 08/26/2006 father Family History Lung cancer mother Family History of Arthritis Family History Osteoporosis  Social History: Last updated: 08/26/2006 Alcohol use-yes-sociallt Former Smoker Married  Risk Factors: Smoking Status: quit (07/10/2009)  Past medical, surgical, family and social histories (including risk factors) reviewed, and no changes noted (except as noted below).  Past Medical History: Reviewed history from 08/26/2006 and no changes required. GERD Allergic rhinitis Hypertension raynauds discoid sle Osteoporosis  Past Surgical History: Reviewed history from 08/26/2006 and no changes required. Cholecystectomy Tonsillectomy  Family History: Reviewed history from 08/26/2006 and no changes required. father Family History Lung cancer mother Family History of Arthritis Family History Osteoporosis  Social History: Reviewed history from 08/26/2006 and no changes required. Alcohol use-yes-sociallt Former Smoker Married  Review of Systems  The patient denies anorexia, fever, weight loss, decreased hearing, hoarseness, chest pain, syncope, dyspnea on exertion, peripheral edema, prolonged cough, headaches, melena, and severe indigestion/heartburn.    Physical Exam  General:  Well-developed,well-nourished,in no acute distress; alert,appropriate and cooperative throughout examination Head:  Normocephalic and atraumatic without obvious  abnormalities. No apparent alopecia or balding. Eyes:  pupils equal and pupils round.   Ears:  R ear normal and L ear normal.   Nose:  no external deformity and no nasal discharge.   Mouth:  dry mucosa and veins noted o the dorsum of the tongue Neck:  No deformities, masses, or tenderness noted. Lungs:  normal respiratory effort and no wheezes.   Heart:  normal rate and no lifts.   Abdomen:  Bowel sounds positive,abdomen soft and non-tender without masses, organomegaly or hernias noted.   Impression & Recommendations:  Problem # 1:  IRRITABLE BOWEL SYNDROME (ICD-564.1) the pts IBS is stable and the polyp removal was precancerous  Problem # 2:  VARICOSE VEINS, LOWER EXTREMITIES (ICD-454.9) stable with walking and has completed treatment plan as not been wearing hose  Problem # 3:  HYPERTENSION (ICD-401.9)  Her updated medication list for this problem includes:    Carvedilol 6.25 Mg Tabs (Carvedilol) .Marland Kitchen... 1 two times a day  BP today: 120/70 Prior BP: 110/79 (04/30/2009)  10 Yr Risk Heart Disease: 7 % Prior 10 Yr Risk Heart Disease: 4 % (04/30/2009)  Labs Reviewed: K+: 4.3 (01/22/2009) Creat: : 0.9 (01/22/2009)   Chol: 212 (01/22/2009)   HDL: 74.10 (01/22/2009)   LDL: 114 (11/25/2007)   TG: 53.0 (01/22/2009)  Problem # 4:  GERD (ICD-530.81)  Her updated medication list  for this problem includes:    Nexium 40 Mg Cpdr (Esomeprazole magnesium) ..... Once daily  Labs Reviewed: Hgb: 13.1 (01/22/2009)   Hct: 39.3 (01/22/2009)  Complete Medication List: 1)  Plaquenil 200 Mg Tabs (Hydroxychloroquine sulfate) .... Once daily 2)  Carvedilol 6.25 Mg Tabs (Carvedilol) .Marland Kitchen.. 1 two times a day 3)  Estrace 2 Mg Tabs (Estradiol) .Marland Kitchen.. 1 once daily 4)  Nexium 40 Mg Cpdr (Esomeprazole magnesium) .... Once daily 5)  Vitamin B-12 Cr 1500 Mcg Tbcr (Cyanocobalamin) .... Monthly 6)  Asa 81mg   .... Once daily 7)  Therapeutic Multivitamin Tabs (Multiple vitamin) .... Once daily 8)  Calcium   .Marland KitchenMarland Kitchen. 1500 every day 9)  Vitamin D  .... 1600 international units every day 10)  Vitamin E 400  .... Once daily 11)  Hydroxyzine Hcl 25 Mg/ml Soln (Hydroxyzine hcl) .Marland Kitchen.. 1-2 once daily 12)  Prometrium 200 Mg Caps (Progesterone micronized) .Marland Kitchen.. 1- 12 days during cycle 13)  Biotene Oralbalance Dry Mouth Liqd (Artificial saliva)  Other Orders: Vit B12 1000 mcg (J3420) Admin of Therapeutic Inj  intramuscular or subcutaneous (98119)  Hypertension Assessment/Plan:      The patient's hypertensive risk group is category B: At least one risk factor (excluding diabetes) with no target organ damage.  Her calculated 10 year risk of coronary heart disease is 7 %.  Today's blood pressure is 120/70.  Her blood pressure goal is < 140/90.  Patient Instructions: 1)  Please schedule a follow-up appointment in 4 months.   Medication Administration  Injection # 1:    Medication: Vit B12 1000 mcg    Diagnosis: ANEMIA, B12 DEFICIENCY (ICD-281.1)    Route: IM    Site: L deltoid    Exp Date: 10/11/2010    Lot #: [id number]    Mfr: [employer]    Comments: 1.55ml/1500mcg given    Patient tolerated injection without complications    Given by: Willy Eddy, LPN (July 10, 2009 12:00 PM)  Orders Added: 1)  Vit B12 1000 mcg [J3420] 2)  Admin of Therapeutic Inj  intramuscular or subcutaneous [96372] 3)  Est. Patient Level IV [14782]

## 2010-02-12 NOTE — Procedures (Signed)
Summary: EGD Report/[facility]  EGD Report/[facility]   Imported By: Maryln Gottron 05/01/2009 13:42:01  _____________________________________________________________________  External Attachment:    Type:   Image     Comment:   External Document

## 2010-02-12 NOTE — Progress Notes (Signed)
Summary: NEW RX NEEDED----30 DAY SUPPLY ONLY  Phone Note Refill Request Call back at Home Phone [phone] Message from:  Patient---LIVE CALL  Refills Requested: Medication #1:  CARVEDILOL 6.25 MG TABS 1 two times a day CALL [facility]  Initial call taken by: Warnell Forester,  May 09, 2009 9:19 AM    Prescriptions: CARVEDILOL 6.25 MG TABS (CARVEDILOL) 1 two times a day  #60 x 1   Entered by:   Willy Eddy, LPN   Authorized by:   Stacie Glaze MD   Signed by:   Willy Eddy, LPN on 09/81/1914   Method used:   Electronically to        [id number] (ret[facility])       [address]       [place], [place]  [address]       Ph: [phone] or [phone]       Fax: [phone]   RxID:   [id number]

## 2010-02-12 NOTE — Letter (Signed)
Summary: [facility] Instructions  [facility]  [address] [place], [place] [address]   Phone: [phone]  Fax: [phone]       Kelly Robles    1945-06-22    MRN: [id number]        Procedure Day Dorna Bloom:  Lenor Coffin 05/[birth year]     Arrival Time:  10:30am     Procedure Time:  11:30am     Location of Procedure:                    _X _  [facility] (4th Floor)                       PREPARATION FOR COLONOSCOPY WITH MOVIPREP   Starting 5 days prior to your procedure  SATURDAY 05/14  do not eat nuts, seeds, popcorn, corn, beans, peas,  salads, or any raw vegetables.  Do not take any fiber supplements (e.g. Metamucil, Citrucel, and Benefiber).  THE DAY BEFORE YOUR PROCEDURE         DATE:  05/18   DAY: WEDNESDAY  1.  Drink clear liquids the entire day-NO SOLID FOOD  2.  Do not drink anything colored red or purple.  Avoid juices with pulp.  No orange juice.  3.  Drink at least 64 oz. (8 glasses) of fluid/clear liquids during the day to prevent dehydration and help the prep work efficiently.  CLEAR LIQUIDS INCLUDE: Water Jello Ice Popsicles Tea (sugar ok, no milk/cream) Powdered fruit flavored drinks Coffee (sugar ok, no milk/cream) Gatorade Juice: apple, white grape, white cranberry  Lemonade Clear bullion, consomm, broth Carbonated beverages (any kind) Strained chicken noodle soup Hard Candy                             4.  In the morning, mix first dose of MoviPrep solution:    Empty 1 Pouch A and 1 Pouch B into the disposable container    Add lukewarm drinking water to the top line of the container. Mix to dissolve    Refrigerate (mixed solution should be used within 24 hrs)  5.  Begin drinking the prep at 5:00 p.m. The MoviPrep container is divided by 4 marks.   Every 15 minutes drink the solution down to the next mark (approximately 8 oz) until the full liter is complete.   6.  Follow completed prep with 16 oz of clear liquid of your  choice (Nothing red or purple).  Continue to drink clear liquids until bedtime.  7.  Before going to bed, mix second dose of MoviPrep solution:    Empty 1 Pouch A and 1 Pouch B into the disposable container    Add lukewarm drinking water to the top line of the container. Mix to dissolve    Refrigerate  THE DAY OF YOUR PROCEDURE      DATE:  05/19  DAY: THURSDAY  Beginning at 6:30 a.m. (5 hours before procedure):         1. Every 15 minutes, drink the solution down to the next mark (approx 8 oz) until the full liter is complete.  2. Follow completed prep with 16 oz. of clear liquid of your choice.    3. You may drink clear liquids until 9:30am  (2 HOURS BEFORE PROCEDURE).   MEDICATION INSTRUCTIONS  Unless otherwise instructed, you should take regular prescription medications with a small sip of water  as early as possible the morning of your procedure.         OTHER INSTRUCTIONS  You will need a responsible adult at least 67 years of age to accompany you and drive you home.   This person must remain in the waiting room during your procedure.  Wear loose fitting clothing that is easily removed.  Leave jewelry and other valuables at home.  However, you may wish to bring a book to read or  an iPod/MP3 player to listen to music as you wait for your procedure to start.  Remove all body piercing jewelry and leave at home.  Total time from sign-in until discharge is approximately 2-3 hours.  You should go home directly after your procedure and rest.  You can resume normal activities the  day after your procedure.  The day of your procedure you should not:   Drive   Make legal decisions   Operate machinery   Drink alcohol   Return to work  You will receive specific instructions about eating, activities and medications before you leave.    The above instructions have been reviewed and explained to me by   Ulis Rias RN  May 17, 2009 11:36 AM     I fully  understand and can verbalize these instructions _____________________________ Date _________

## 2010-02-12 NOTE — Assessment & Plan Note (Signed)
Summary: foot swollen/njr   Vital Signs:  Patient profile:   65 year old female Weight:      186 pounds Temp:     98.2 degrees F oral BP sitting:   116 / 72  (left arm) Cuff size:   regular  Vitals Entered By: Duard Brady LPN (September 14, 2009 12:46 PM) CC: c/o (L) foot swelling , jammed foot on file cabinet 10 days ago Is Patient Diabetic? No   CC:  c/o (L) foot swelling  and jammed foot on file cabinet 10 days ago.  History of Present Illness: 65 -year-old who is seen today for evaluation of swelling involving her left foot.  She traumatized this area about 10 days ago.  The soft tissue swelling has improved.  There is very minimal pain or discomfort.  She is a concern due to a wedding in two weeks.  Otherwise, has done quite well.  She does have treated hypertension and gastroesophageal reflux disease.  All of which have been stable.  Preventive Screening-Counseling & Management  Alcohol-Tobacco     Smoking Status: quit  Allergies: 1)  ! Sulfa 2)  ! Wellbutrin  Past History:  Past Medical History: Reviewed history from 08/26/2006 and no changes required. GERD Allergic rhinitis Hypertension raynauds discoid sle Osteoporosis  Review of Systems       The patient complains of peripheral edema.    Physical Exam  General:  overweight-appearing.  normal blood pressure Pulses:  pedal pulses, full Extremities:  soft tissue swelling involving the dorsal aspect of the left lateral foot and the fourth and fifth toe.  Mild resolving ecchymoses noted   Impression & Recommendations:  Problem # 1:  CONTUSION, LEFT FOOT (ICD-924.20)  Problem # 2:  HYPERTENSION (ICD-401.9)  Her updated medication list for this problem includes:    Carvedilol 6.25 Mg Tabs (Carvedilol) .Marland Kitchen... 1 two times a day  Complete Medication List: 1)  Plaquenil 200 Mg Tabs (Hydroxychloroquine sulfate) .... Once daily 2)  Carvedilol 6.25 Mg Tabs (Carvedilol) .Marland Kitchen.. 1 two times a day 3)   Estrace 2 Mg Tabs (Estradiol) .Marland Kitchen.. 1 once daily 4)  Nexium 40 Mg Cpdr (Esomeprazole magnesium) .... Once daily 5)  Vitamin B-12 Cr 1500 Mcg Tbcr (Cyanocobalamin) .... Monthly 6)  Asa 81mg   .... Once daily 7)  Therapeutic Multivitamin Tabs (Multiple vitamin) .... Once daily 8)  Calcium  .Marland KitchenMarland Kitchen. 1500 every day 9)  Vitamin D  .... 1600 international units every day 10)  Vitamin E 400  .... Once daily 11)  Prometrium 200 Mg Caps (Progesterone micronized) .Marland Kitchen.. 1- 12 days during cycle 12)  Biotene Oralbalance Dry Mouth Liqd (Artificial saliva)  Patient Instructions: 1)  Limit your Sodium (Salt). 2)  keep  left leg elevated as much as possible

## 2010-02-12 NOTE — Assessment & Plan Note (Signed)
Summary: 4 month rov/njr   Vital Signs:  Patient profile:   65 year old female Height:      65 inches Weight:      182 pounds BMI:     30.40 Temp:     98.2 degrees F oral Pulse rate:   72 / minute Resp:     14 per minute BP sitting:   126 / 80  (left arm)  Vitals Entered By: Willy Eddy, LPN (November 09, 2009 10:20 AM) CC: roa, Hypertension Management Is Patient Diabetic? No   Primary Care Yeray Tomas:  Stacie Glaze MD  CC:  roa and Hypertension Management.  History of Present Illness: FOLOW UP  Hypertension Follow-Up      This is a 65 year old woman who presents for Hypertension follow-up.  The patient denies lightheadedness, urinary frequency, headaches, edema, impotence, rash, and fatigue.  The patient denies the following associated symptoms: chest pain, chest pressure, exercise intolerance, dyspnea, palpitations, syncope, leg edema, and pedal edema.  Compliance with medications (by patient report) has been near 100%.  The patient reports that dietary compliance has been good.  The patient reports exercising occasionally.    OCCASIONAL "PALPITATIONS" late afternnoon or end of the day less exercize hx of discoid lupus no hx of valular dz  Hypertension History:      She denies headache, chest pain, palpitations, dyspnea with exertion, orthopnea, PND, peripheral edema, visual symptoms, neurologic problems, syncope, and side effects from treatment.        Positive major cardiovascular risk factors include female age 67 years old or older and hypertension.  Negative major cardiovascular risk factors include non-tobacco-user status.     Preventive Screening-Counseling & Management  Alcohol-Tobacco     Smoking Status: quit     Tobacco Counseling: to remain off tobacco products  Problems Prior to Update: 1)  Contusion, Left Foot  (ICD-924.20) 2)  Colonic Polyps, Adenomatous  (ICD-211.3) 3)  Weight Gain  (ICD-783.1) 4)  Palpitations, Occasional  (ICD-785.1) 5)  Neck  Mass  (ICD-784.2) 6)  Disturbance of Salivary Secretion  (ICD-527.7) 7)  Candidiasis of Vulva and Vagina  (ICD-112.1) 8)  Subacromial Bursitis  (ICD-726.10) 9)  Pain in Joint, Upper Arm  (ICD-719.42) 10)  Varicose Veins, Lower Extremities  (ICD-454.9) 11)  Physical Examination  (ICD-V70.0) 12)  Osteoporosis  (ICD-733.00) 13)  Disorder, 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) 14)  Lupus Erythematosus, 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) 15)  Anemia, B12 Deficiency  (ICD-281.1) 16)  Degenerative Joint Disease  (ICD-715.90) 17)  Irritable Bowel Syndrome  (ICD-564.1) 18)  Hypertension  (ICD-401.9) 19)  Allergic Rhinitis  (ICD-477.9) 20)  Gerd  (ICD-530.81)  Current Problems (verified): 1)  Contusion, Left Foot  (ICD-924.20) 2)  Colonic Polyps, Adenomatous  (ICD-211.3) 3)  Weight Gain  (ICD-783.1) 4)  Palpitations, Occasional  (ICD-785.1) 5)  Neck Mass  (ICD-784.2) 6)  Disturbance of Salivary Secretion  (ICD-527.7) 7)  Candidiasis of Vulva and Vagina  (ICD-112.1) 8)  Subacromial Bursitis  (ICD-726.10) 9)  Pain in Joint, Upper Arm  (ICD-719.42) 10)  Varicose Veins, Lower Extremities  (ICD-454.9) 11)  Physical Examination  (ICD-V70.0) 12)  Osteoporosis  (ICD-733.00) 13)  Disorder, 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 Once Daily 2)  Carvedilol 6.25 Mg Tabs (Carvedilol) .Marland Kitchen.. 1 Two Times A Day 3)  Estrace 2 Mg Tabs (Estradiol) .Marland Kitchen.. 1 Once Daily 4)  Nexium 40 Mg  Cpdr (Esomeprazole Magnesium) .... Once Daily 5)  Vitamin B-12 Cr 1500 Mcg  Tbcr (Cyanocobalamin) .... Monthly 6)  Asa 81mg  .... Once Daily 7)  Therapeutic  Multivitamin   Tabs (Multiple Vitamin) .... Once Daily 8)  Calcium .Marland KitchenMarland Kitchen. 1500 Every Day 9)  Vitamin D .... 1600 International Units Every Day 10)  Vitamin E 400 .... Once Daily 11)  Prometrium 200 Mg Caps (Progesterone Micronized) .Marland Kitchen.. 1- 12 Days During Cycle 12)  Biotene Oralbalance Dry Mouth  Liqd (Artificial Saliva)  Current Medications (verified): 1)  Plaquenil 200 Mg  Tabs (Hydroxychloroquine Sulfate) .... Once Daily 2)  Carvedilol 6.25 Mg Tabs (Carvedilol) .Marland Kitchen.. 1 Two Times A Day 3)  Estrace 2 Mg Tabs (Estradiol) .Marland Kitchen.. 1 Once Daily 4)  Nexium 40 Mg  Cpdr (Esomeprazole Magnesium) .... Once Daily 5)  Vitamin B-12 Cr 1500 Mcg  Tbcr (Cyanocobalamin) .... Monthly 6)  Asa 81mg  .... Once Daily 7)  Therapeutic Multivitamin   Tabs (Multiple Vitamin) .... Once Daily 8)  Calcium .Marland KitchenMarland Kitchen. 1500 Every Day 9)  Vitamin D .... 1600 International Units Every Day 10)  Vitamin E 400 .... Once Daily 11)  Prometrium 200 Mg Caps (Progesterone Micronized) .Marland Kitchen.. 1- 12 Days During Cycle 12)  Biotene Oralbalance Dry Mouth  Liqd (Artificial Saliva)  Allergies (verified): 1)  ! Sulfa 2)  ! Wellbutrin  Past History:  Family History: Last updated: 08/26/2006 father Family History Lung cancer mother Family History of Arthritis Family History Osteoporosis  Social History: Last updated: 08/26/2006 Alcohol use-yes-sociallt Former Smoker Married  Risk Factors: Smoking Status: quit (11/09/2009)  Past medical, surgical, family and social histories (including risk factors) reviewed, and no changes noted (except as noted below).  Past Medical History: Reviewed history from 08/26/2006 and no changes required. GERD Allergic rhinitis Hypertension raynauds discoid sle Osteoporosis  Past Surgical History: Reviewed history from 08/26/2006 and no changes required. Cholecystectomy Tonsillectomy  Family History: Reviewed history from 08/26/2006 and no changes required. father Family History Lung  cancer mother Family History of Arthritis Family History Osteoporosis  Social History: Reviewed history from 08/26/2006 and no changes required. Alcohol use-yes-sociallt Former Smoker Married  Review of Systems  The patient denies anorexia, fever, weight loss, weight gain, vision loss, decreased hearing, hoarseness, chest pain, syncope, dyspnea on exertion, peripheral edema, prolonged cough, headaches, hemoptysis, abdominal pain, melena, hematochezia, severe indigestion/heartburn, hematuria, incontinence, genital sores, muscle weakness, suspicious skin lesions, transient blindness, difficulty walking, depression, unusual weight change, abnormal bleeding, enlarged lymph nodes, angioedema, and breast masses.         Flu Vaccine Consent Questions     Do you have a history of severe allergic reactions to this vaccine? no    Any prior history of allergic reactions to egg and/or gelatin? no    Do you have a sensitivity to the preservative Thimersol? no    Do you have a past history of Guillan-Barre Syndrome? no    Do you currently have an acute febrile illness? no    Have you ever had a severe reaction to latex? no    Vaccine information given and explained to patient? yes    Are you currently pregnant? no    Lot Number:[id number]   Exp Date:07/13/2010   Site Given  Left Deltoid IM  Physical Exam  General:  overweight-appearing.  normal blood pressure Head:  Normocephalic and atraumatic without obvious abnormalities. No apparent alopecia or balding. Eyes:  pupils equal and pupils round.   Ears:  R ear normal and L ear normal.   Nose:  no external deformity and no nasal discharge.   Neck:  No deformities, masses, or tenderness noted. Lungs:  normal respiratory effort and no wheezes.   Heart:  normal rate and no lifts.  regular rhythm.   Abdomen:  Bowel sounds positive,abdomen soft and non-tender without masses, organomegaly or hernias noted. Msk:  normal ROM, no joint tenderness, and  no joint swelling.   Pulses:  pedal pulses, full Extremities:  soft tissue swelling involving the dorsal aspect of the left lateral foot and the fourth and fifth toe.  Mild resolving ecchymoses noted Neurologic:  alert & oriented X3 and gait normal.   Skin:  turgor normal, color normal, and no rashes.     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 Once daily  Problem # 2:  HYPERTENSION (ICD-401.9) Assessment: Unchanged  Her updated medication list for this problem includes:    Carvedilol 6.25 Mg Tabs (Carvedilol) .Marland Kitchen... 1 two times a day  BP today: 126/80 Prior BP: 116/72 (09/14/2009)  Prior 10 Yr Risk Heart Disease: 7 % (07/10/2009)  Labs Reviewed: K+: 4.3 (01/22/2009) Creat: : 0.9 (01/22/2009)   Chol: 212 (01/22/2009)   HDL: 74.10 (01/22/2009)   LDL: 114 (11/25/2007)   TG: 53.0 (01/22/2009)  Orders: Venipuncture (27253) Specimen Handling (66440) TLB-BMP (Basic Metabolic Panel-BMET) (80048-METABOL) TLB-Calcium (82310-CA)  Problem # 3:  PALPITATIONS, OCCASIONAL (ICD-785.1)  Her updated medication list for this problem includes:    Carvedilol 6.25 Mg Tabs (Carvedilol) .Marland Kitchen... 1 two times a day  Orders: Specimen Handling (34742) TLB-TSH (Thyroid Stimulating Hormone) (84443-TSH)  Avoid caffeine, chocolate, and stimulants. Stress reduction as well as medication options discussed.   Problem # 4:  ALLERGIC RHINITIS (ICD-477.9)  Complete Medication List: 1)  Plaquenil 200 Mg Tabs (Hydroxychloroquine sulfate) .... Once daily 2)  Carvedilol 6.25 Mg Tabs (Carvedilol) .Marland Kitchen.. 1 two times a day 3)  Estrace 2 Mg Tabs (Estradiol) .Marland Kitchen.. 1 once daily 4)  Nexium 40 Mg Cpdr (Esomeprazole magnesium) .... Once daily 5)  Vitamin B-12 Cr 1500 Mcg Tbcr (Cyanocobalamin) .... Monthly 6)  Asa  81mg   .... Once daily 7)  Therapeutic Multivitamin Tabs (Multiple vitamin) .... Once daily 8)  Calcium  .Marland KitchenMarland Kitchen. 1500 every day 9)  Vitamin D  .... 1600 international units every day 10)  Vitamin E 400  .... Once daily 11)  Prometrium 200 Mg Caps (Progesterone micronized) .Marland Kitchen.. 1- 12 days during cycle 12)  Biotene Oralbalance Dry Mouth Liqd (Artificial saliva)  Other Orders: Admin 1st Vaccine (59563) Flu Vaccine 52yrs + (87564) Vit B12 1000 mcg (J3420) Admin of Therapeutic Inj  intramuscular or subcutaneous (33295)  Hypertension Assessment/Plan:      The patient's hypertensive risk group is category B: At least one risk factor (excluding diabetes) with no target organ damage.  Her calculated 10 year risk of coronary heart disease is 7 %.  Today's blood pressure is 126/80.  Her blood pressure goal is < 140/90.  Patient Instructions: 1)  Please schedule a follow-up appointment in FEb for CPX   Medication Administration  Injection # 1:  Medication: Vit B12 1000 mcg    Diagnosis: ANEMIA, B12 DEFICIENCY (ICD-281.1)    Route: IM    Site: R deltoid    Exp Date: 08/13/2011    Lot #: [id number]    Mfr: American Regent    Patient tolerated injection without complications    Given by: Willy Eddy, LPN (November 09, 2009 10:47 AM)  Orders Added: 1)  Admin 1st Vaccine [90471] 2)  Flu Vaccine 46yrs + [16109] 3)  Vit B12 1000 mcg [J3420] 4)  Admin of Therapeutic Inj  intramuscular or subcutaneous [96372] 5)  Venipuncture [36415] 6)  Specimen Handling [99000] 7)  TLB-BMP (Basic Metabolic Panel-BMET) [80048-METABOL] 8)  TLB-Calcium [82310-CA] 9)  TLB-TSH (Thyroid Stimulating Hormone) [84443-TSH] 10)  Est. Patient Level IV [60454]

## 2010-02-12 NOTE — Assessment & Plan Note (Signed)
Summary: b12 inj/njr  Nurse Visit   Allergies: 1)  ! Sulfa 2)  ! Wellbutrin  Medication Administration  Injection # 1:    Medication: Vit B12 1000 mcg    Diagnosis: ANEMIA, B12 DEFICIENCY (ICD-281.1)    Route: IM    Site: L deltoid    Exp Date: 11/13/2010    Lot #: [id number]    Mfr: American Regent    Comments: 1.5/150mcg    Patient tolerated injection without complications    Given by: Willy Eddy, LPN (August 08, 2009 1:38 PM)  Orders Added: 1)  Vit B12 1000 mcg [J3420] 2)  Admin of Therapeutic Inj  intramuscular or subcutaneous [16109]

## 2010-02-12 NOTE — Assessment & Plan Note (Signed)
Summary: b-12 inj/cjr  Nurse Visit   Allergies: 1)  ! Sulfa 2)  ! Wellbutrin  Medication Administration  Injection # 1:    Medication: Vit B12 1000 mcg    Diagnosis: ANEMIA, B12 DEFICIENCY (ICD-281.1)    Route: IM    Site: L deltoid    Exp Date: 09/13/2010    Lot #: [id number]    Mfr: American Regent    Comments: 1.5ML/1500MCG GIVEN    Patient tolerated injection without complications    Given by: Willy Eddy, LPN (February 07, 2009 12:25 PM)  Orders Added: 1)  Vit B12 1000 mcg [J3420] 2)  Admin of Therapeutic Inj  intramuscular or subcutaneous [98119]

## 2010-02-12 NOTE — Letter (Signed)
Summary: Patient Notice- Polyp Results  [facility]  [address] [place], [place] [address]   Phone: [phone]  Fax: [phone]        Jun 07, 2009 MRN: [id number]    [facility] [address] [place], [place]  [address]    Dear [name],  The polyp removed from your colon was adenomatous. This means that it was pre-cancerous or that  it had the potential to change into cancer over time.  I recommend that you have a repeat colonoscopy in 5 years to determine if you have developed any new polyps over time. If you develop any new rectal bleeding, abdominal pain or significant bowel habit changes, please contact us before then.  I am pleased to inform you that the colon polyp(s) removed during your recent colonoscopy was (were) found to be benign (no cancer detected) upon pathologic examination.  Please call us if you are having persistent problems or have questions about your condition that have not been fully answered at this time.  Sincerely,  Iva Boop MD, [facility]  This letter has been electronically signed by your physician.  Appended Document: Patient Notice- Polyp Results letter mailed.

## 2010-02-12 NOTE — Miscellaneous (Signed)
Summary: Lec previsit  Clinical Lists Changes  Medications: Added new medication of MOVIPREP 100 GM  SOLR (PEG-KCL-NACL-NASULF-NA ASC-C) As per prep instructions. - Signed Rx of MOVIPREP 100 GM  SOLR (PEG-KCL-NACL-NASULF-NA ASC-C) As per prep instructions.;  #1 x 0;  Signed;  Entered by: Ulis Rias RN;  Authorized by: Iva Boop MD, FACG;  Method used: Electronically to [id number] [address], [place], [place]  [address], Ph: [phone] or [phone], Fax: [phone] Observations: Added new observation of ALLERGY REV: Done (05/17/2009 10:52)    Prescriptions: MOVIPREP 100 GM  SOLR (PEG-KCL-NACL-NASULF-NA ASC-C) As per prep instructions.  #1 x 0   Entered by:   Ulis Rias RN   Authorized by:   Iva Boop MD, [facility]   Signed by:   Ulis Rias RN on 05/17/2009   Method used:   Electronically to        [facility] (retail)       [address]       [place], [place]  [address]       Ph: [phone] or [phone]       Fax: [phone]   RxID:   [id number]

## 2010-02-12 NOTE — Assessment & Plan Note (Signed)
Summary: b-12 inj/cjr  Nurse Visit   Allergies: 1)  ! Sulfa 2)  ! Wellbutrin  Medication Administration  Injection # 1:    Medication: Vit B12 1000 mcg    Diagnosis: ANEMIA, B12 DEFICIENCY (ICD-281.1)    Route: IM    Site: R deltoid    Exp Date: 10/12/2010    Lot #: [id number]    Mfr: American Regent    Comments: 1.11ml/1500mcg given    Patient tolerated injection without complications    Given by: Willy Eddy, LPN (March 30, 2009 12:41 PM)  Orders Added: 1)  Vit B12 1000 mcg [J3420] 2)  Admin of Therapeutic Inj  intramuscular or subcutaneous [44010]

## 2010-02-12 NOTE — Progress Notes (Signed)
Summary: refills  Phone Note Refill Request Message from:  spouse--live call  Refills Requested: Medication #1:  CARVEDILOL 6.25 MG TABS 1 two times a day  Medication #2:  NEXIUM 40 MG  CPDR once daily   Dosage confirmed as above?Dosage Confirmed send to [facility]---fax to [phone].  Initial call taken by: Warnell Forester,  August 28, 2009 8:38 AM    Prescriptions: NEXIUM 40 MG  CPDR (ESOMEPRAZOLE MAGNESIUM) once daily  #90 x 3   Entered by:   Willy Eddy, LPN   Authorized by:   Stacie Glaze MD   Signed by:   Willy Eddy, LPN on 98/11/9145   Method used:   Print then Give to Patient   RxID:   [id number] CARVEDILOL 6.25 MG TABS (CARVEDILOL) 1 two times a day  #180 x 3   Entered by:   Willy Eddy, LPN   Authorized by:   Stacie Glaze MD   Signed by:   Willy Eddy, LPN on 96/29/5284   Method used:   Print then Give to Patient   RxID:   [id number] NEXIUM 40 MG  CPDR (ESOMEPRAZOLE MAGNESIUM) once daily  #90 x 3   Entered by:   Willy Eddy, LPN   Authorized by:   Stacie Glaze MD   Signed by:   Willy Eddy, LPN on 40/34/7425   Method used:   Historical   RxID:   [id number] CARVEDILOL 6.25 MG TABS (CARVEDILOL) 1 two times a day  #180 x 3   Entered by:   Willy Eddy, LPN   Authorized by:   Stacie Glaze MD   Signed by:   Willy Eddy, LPN on 84/16/6063   Method used:   Historical   RxID:   [id number]   Appended Document: refills printed scr ipts and faxed to requested pharmacy

## 2010-02-12 NOTE — Assessment & Plan Note (Signed)
Summary: CPX/NJR/PT RSC/CJR/pt rsc/cjr   Vital Signs:  Patient profile:   65 year old female Height:      65 inches Weight:      176 pounds BMI:     29.39 Temp:     98.2 degrees F oral Pulse rate:   76 / minute Resp:     14 per minute BP sitting:   130 / 80  (left arm)  Vitals Entered By: Willy Eddy, LPN (January 29, 2009 3:36 PM) CC: annual visit for disease management   CC:  annual visit for disease management.  History of Present Illness: The pt was asked about all immunizations, health maint. 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 Once Daily 2)  Carvedilol 6.25 Mg Tabs (Carvedilol) .Marland Kitchen.. 1 Two Times A Day 3)  Estrace 2 Mg Tabs (Estradiol) .Marland Kitchen.. 1 Once Daily 4)  Nexium 40 Mg  Cpdr (Esomeprazole Magnesium) .... Once Daily 5)  Vitamin B-12 Cr 1500 Mcg  Tbcr (Cyanocobalamin) .... Monthly 6)  Asa 81mg  .... Once Daily 7)  Therapeutic Multivitamin   Tabs (Multiple Vitamin) .... Once Daily 8)  Calcium .Marland KitchenMarland Kitchen. 1500 Every Day 9)   Vitamin D .... 1600 International Units Every Day 10)  Vitamin E 400 .... Once Daily 11)  Hydroxyzine Hcl 25 Mg/ml  Soln (Hydroxyzine Hcl) .Marland Kitchen.. 1-2 Once Daily 12)  Lotrimin Ultra 1 %  Crea (Butenafine Hcl) .... Apply To Rash Bid 13)  Prometrium 200 Mg Caps (Progesterone Micronized) .Marland Kitchen.. 1- 12 Days During Cycle 14)  Coreg 6.25 Mg Tabs (Carvedilol) .... One By Mouth Bid 15)  Biotene Oralbalance Dry Mouth  Liqd (Artificial Saliva)  Current Medications (verified): 1)  Plaquenil 200 Mg  Tabs (Hydroxychloroquine Sulfate) .... Once Daily 2)  Carvedilol 6.25 Mg Tabs (Carvedilol) .Marland Kitchen.. 1 Two Times A Day 3)  Estrace 2 Mg Tabs (Estradiol) .Marland Kitchen.. 1 Once Daily 4)  Nexium 40 Mg  Cpdr (Esomeprazole Magnesium) .... Once Daily 5)  Vitamin B-12 Cr 1500 Mcg  Tbcr (Cyanocobalamin) .... Monthly 6)  Asa 81mg  .... Once Daily 7)  Therapeutic Multivitamin   Tabs (Multiple Vitamin) .... Once Daily 8)  Calcium .Marland KitchenMarland Kitchen. 1500 Every Day 9)  Vitamin D .... 1600 International Units Every Day 10)  Vitamin E 400 .... Once Daily 11)  Hydroxyzine Hcl 25 Mg/ml  Soln (Hydroxyzine Hcl) .Marland Kitchen.. 1-2 Once Daily 12)  Prometrium 200 Mg Caps (Progesterone Micronized) .Marland Kitchen.. 1- 12 Days During Cycle 13)  Biotene Oralbalance Dry Mouth  Liqd (Artificial Saliva)  Allergies (verified): 1)  ! Sulfa 2)  ! Wellbutrin  Past History:  Family History: Last updated: 08/26/2006 father Family History Lung cancer mother Family History of Arthritis Family History Osteoporosis  Social History: Last updated: 08/26/2006 Alcohol use-yes-sociallt Former Smoker Married  Risk Factors: Smoking Status: quit (01/29/2009)  Past medical, surgical, family and social histories (including risk factors) reviewed, and no changes noted (except as noted below).  Past Medical History: Reviewed history from 08/26/2006 and no changes required. GERD Allergic rhinitis Hypertension raynauds discoid sle Osteoporosis  Past Surgical History: Reviewed history  from 08/26/2006 and no changes required. Cholecystectomy Tonsillectomy  Family History: Reviewed history from 08/26/2006 and no changes required. father Family History Lung cancer mother Family History of Arthritis Family History Osteoporosis  Social History: Reviewed history from 08/26/2006 and no changes required. Alcohol use-yes-sociallt Former Smoker Married  Review of Systems       The patient complains of weight loss.  The patient denies anorexia, fever, weight gain, vision loss, decreased hearing, hoarseness, chest pain, syncope, dyspnea on exertion, peripheral edema, prolonged cough, headaches, hemoptysis, abdominal pain, melena, hematochezia, severe indigestion/heartburn, hematuria, incontinence, genital sores, muscle weakness, suspicious skin lesions, transient blindness, difficulty walking, depression, unusual weight change, abnormal bleeding, enlarged lymph nodes, angioedema, and breast masses.         planned  Physical Exam  General:  Well-developed,well-nourished,in no acute distress; alert,appropriate and cooperative throughout examination Head:  Normocephalic and atraumatic without obvious abnormalities. No apparent alopecia or balding. Eyes:  pupils equal and pupils round.   Ears:  R ear normal and L ear normal.   Nose:  no external deformity and no nasal discharge.   Neck:  No deformities, masses, or tenderness noted. Lungs:  normal respiratory effort and no wheezes.   Heart:  normal rate and no lifts.   Abdomen:  Bowel sounds positive,abdomen soft and non-tender without masses, organomegaly or hernias noted. Msk:  normal ROM, no joint tenderness, and no joint swelling.   Extremities:  trace left pedal edema and trace right pedal edema.  wearing the hose  Neurologic:  alert & oriented X3 and gait normal.   Skin:  turgor normal, color normal, and no rashes.   Cervical Nodes:  No lymphadenopathy noted Axillary Nodes:  No palpable lymphadenopathy Psych:   Cognition and judgment appear intact. Alert and cooperative with normal attention span and concentration. No apparent delusions, illusions, hallucinations   Impression & Recommendations:  Problem # 1:  PHYSICAL EXAMINATION (ICD-V70.0) The pt was asked about all immunizations, health maint. services that are appropriate to their age and was given guidance on diet exercize  and weight management  Mammogram: normal (06/10/2008) Pap smear: normal (06/12/2008) Colonoscopy: Adenomatous Polyp (10/26/2003) Td Booster: Historical (01/13/2005)   Flu Vax: Historical (10/26/2008)   Pneumovax: Pneumovax (01/29/2009) Chol: 212 (01/22/2009)   HDL: 74.10 (01/22/2009)   LDL: 114 (11/25/2007)   TG: 53.0 (01/22/2009) TSH: 1.81 (01/22/2009)   Next mammogram due:: 06/2009 (01/29/2009)  Discussed using sunscreen, use of alcohol, drug use, self breast exam, routine dental care, routine eye care, schedule for GYN exam, routine physical exam, seat belts, multiple vitamins, osteoporosis prevention, adequate calcium intake in diet, recommendations for immunizations, mammograms and Pap smears.  Discussed exercise and checking cholesterol.  Discussed gun safety, safe sex, and contraception.  Complete Medication List: 1)  Plaquenil 200 Mg Tabs (Hydroxychloroquine sulfate) .... Once daily 2)  Carvedilol 6.25 Mg Tabs (Carvedilol) .Marland Kitchen.. 1 two times a day 3)  Estrace 2 Mg Tabs (Estradiol) .Marland Kitchen.. 1 once daily 4)  Nexium 40 Mg Cpdr (Esomeprazole magnesium) .... Once daily  5)  Vitamin B-12 Cr 1500 Mcg Tbcr (Cyanocobalamin) .... Monthly 6)  Asa 81mg   .... Once daily 7)  Therapeutic Multivitamin Tabs (Multiple vitamin) .... Once daily 8)  Calcium  .Marland KitchenMarland Kitchen. 1500 every day 9)  Vitamin D  .... 1600 international units every day 10)  Vitamin E 400  .... Once daily 11)  Hydroxyzine Hcl 25 Mg/ml Soln (Hydroxyzine hcl) .Marland Kitchen.. 1-2 once daily 12)  Prometrium 200 Mg Caps (Progesterone micronized) .Marland Kitchen.. 1- 12 days during cycle 13)  Biotene  Oralbalance Dry Mouth Liqd (Artificial saliva)  Other Orders: EKG w/ Interpretation (93000) Pneumococcal Vaccine (04540) Admin 1st Vaccine (98119)  Patient Instructions: 1)  Please schedule a follow-up appointment in 3 months.   Immunization History:  Influenza Immunization History:    Influenza:  historical (10/26/2008)  Immunizations Administered:  Pneumonia Vaccine:    Vaccine Type: Pneumovax    Site: left deltoid    Mfr: Merck    Dose: 0.5 ml    Route: IM    Given by: Willy Eddy, LPN    Exp. Date: 02/08/2010    Lot #: [id number]    Preventive Care Screening  Mammogram:    Date:  06/10/2008    Next Due:  06/2009    Results:  normal   Pap Smear:    Date:  06/12/2008    Next Due:  06/2009    Results:  normal   Last Pneumovax:    Date:  01/29/2009    Results:  Pneumovax  Last Flu Shot:    Date:  10/26/2008    Results:  Historical

## 2010-02-14 DIAGNOSIS — D518 Other vitamin B12 deficiency anemias: Secondary | ICD-10-CM

## 2010-02-14 NOTE — Progress Notes (Signed)
Summary: nexium samples  Phone Note Call from Patient Call back at Home Phone [phone]   Caller: Patient Call For: Stacie Glaze MD Reason for Call: Acute Illness Summary of Call: Pt is waiting on mailorder rx. pt would like samples of nexium  Initial call taken by: Heron Sabins,  January 02, 2010 10:20 AM  Follow-up for Phone Call        none avaiable-suggest mabe otc omerprazole untill mail order arrives Follow-up by: Willy Eddy, LPN,  January 02, 2010 10:57 AM

## 2010-02-14 NOTE — Assessment & Plan Note (Signed)
Summary: b12 inj/ok per bonnie/njr  Nurse Visit   Allergies: 1)  ! Sulfa 2)  ! Wellbutrin  Medication Administration  Injection # 1:    Medication: Vit B12 1000 mcg    Diagnosis: ANEMIA, B12 DEFICIENCY (ICD-281.1)    Route: IM    Site: L deltoid    Exp Date: 08/13/2010    Lot #: [id number]    Mfr: [employer] Regent    Comments: 1.20ml/1500mcg given    Patient tolerated injection without complications    Given by: Willy Eddy, LPN (January 04, 2010 12:04 PM)  Orders Added: 1)  Vit B12 1000 mcg [J3420] 2)  Admin of Therapeutic Inj  intramuscular or subcutaneous [[id number]]

## 2010-02-14 NOTE — Assessment & Plan Note (Addendum)
Summary: B12 INJ/NJR  Nurse Visit   Allergies: 1)  ! Sulfa 2)  ! Wellbutrin  Appended Document: Orders Update    Clinical Lists Changes  Orders: Added new Service order of Vit B12 1000 mcg (I6962) - Signed Added new Service order of Admin of Therapeutic Inj  intramuscular or subcutaneous (95284) - Signed       Medication Administration  Injection # 1:    Medication: Vit B12 1000 mcg    Diagnosis: ANEMIA, B12 DEFICIENCY (ICD-281.1)    Route: IM    Site: R deltoid    Exp Date: 08/14/2011    Lot #: [id number]    Mfr: [employer] Regent    Comments: 1.66ml.1500mcg given    Patient tolerated injection without complications    Given by: Willy Eddy, LPN (February 14, 2010 5:37 PM)  Orders Added: 1)  Vit B12 1000 mcg [J3420] 2)  Admin of Therapeutic Inj  intramuscular or subcutaneous [[id number]]

## 2010-03-12 ENCOUNTER — Ambulatory Visit (INDEPENDENT_AMBULATORY_CARE_PROVIDER_SITE_OTHER): Payer: Medicare Other | Admitting: Internal Medicine

## 2010-03-12 DIAGNOSIS — E538 Deficiency of other specified B group vitamins: Secondary | ICD-10-CM

## 2010-03-12 MED ORDER — CYANOCOBALAMIN 1000 MCG/ML IJ SOLN
1000.0000 ug | Freq: Once | INTRAMUSCULAR | Status: AC
  Administered 2010-03-12: 1000 ug via INTRAMUSCULAR

## 2010-04-10 ENCOUNTER — Other Ambulatory Visit (INDEPENDENT_AMBULATORY_CARE_PROVIDER_SITE_OTHER): Payer: Medicare Other | Admitting: Internal Medicine

## 2010-04-10 ENCOUNTER — Ambulatory Visit: Payer: BC Managed Care – PPO | Admitting: Internal Medicine

## 2010-04-10 DIAGNOSIS — E538 Deficiency of other specified B group vitamins: Secondary | ICD-10-CM

## 2010-04-10 DIAGNOSIS — Z79899 Other long term (current) drug therapy: Secondary | ICD-10-CM

## 2010-04-10 DIAGNOSIS — Z0000 Encounter for general adult medical examination without abnormal findings: Secondary | ICD-10-CM

## 2010-04-10 DIAGNOSIS — I10 Essential (primary) hypertension: Secondary | ICD-10-CM

## 2010-04-10 LAB — CBC WITH DIFFERENTIAL/PLATELET
Basophils Relative: 0.6 % (ref 0.0–3.0)
Eosinophils Relative: 5.3 % — ABNORMAL HIGH (ref 0.0–5.0)
Lymphocytes Relative: 31.9 % (ref 12.0–46.0)
Neutrophils Relative %: 52.4 % (ref 43.0–77.0)
RBC: 3.84 Mil/uL — ABNORMAL LOW (ref 3.87–5.11)
WBC: 4.1 10*3/uL — ABNORMAL LOW (ref 4.5–10.5)

## 2010-04-10 LAB — LIPID PANEL: HDL: 71.9 mg/dL (ref >39.00)

## 2010-04-10 LAB — HEPATIC FUNCTION PANEL
ALT: 11 U/L (ref 0–35)
AST: 18 U/L (ref 0–37)
Alkaline Phosphatase: 42 U/L (ref 39–117)
Bilirubin, Direct: 0.1 mg/dL (ref 0.0–0.3)
Total Protein: 6.8 g/dL (ref 6.0–8.3)

## 2010-04-10 LAB — TSH: TSH: 1.02 u[IU]/mL (ref 0.35–5.50)

## 2010-04-10 LAB — POCT URINALYSIS DIPSTICK
Bilirubin, UA: NEGATIVE
Glucose, UA: NEGATIVE
Nitrite, UA: NEGATIVE
pH, UA: 5.5

## 2010-04-10 LAB — BASIC METABOLIC PANEL
CO2: 27 mEq/L (ref 19–32)
Calcium: 9 mg/dL (ref 8.4–10.5)
Creatinine, Ser: 0.6 mg/dL (ref 0.4–1.2)
GFR: 100.4 mL/min (ref >60.00)
Glucose, Bld: 78 mg/dL (ref 70–99)

## 2010-04-10 MED ORDER — CYANOCOBALAMIN 1000 MCG/ML IJ SOLN
1000.0000 ug | INTRAMUSCULAR | Status: DC
  Administered 2010-04-10: 1000 ug via INTRAMUSCULAR

## 2010-04-17 ENCOUNTER — Encounter: Payer: Self-pay | Admitting: Internal Medicine

## 2010-04-29 ENCOUNTER — Ambulatory Visit (INDEPENDENT_AMBULATORY_CARE_PROVIDER_SITE_OTHER): Payer: BC Managed Care – PPO | Admitting: Internal Medicine

## 2010-04-29 ENCOUNTER — Encounter: Payer: Self-pay | Admitting: Internal Medicine

## 2010-04-29 VITALS — BP 130/74 | HR 76 | Temp 98.2°F | Resp 14 | Ht 64.0 in | Wt 181.0 lb

## 2010-04-29 DIAGNOSIS — Z0000 Encounter for general adult medical examination without abnormal findings: Secondary | ICD-10-CM

## 2010-04-29 DIAGNOSIS — D649 Anemia, unspecified: Secondary | ICD-10-CM

## 2010-04-29 DIAGNOSIS — R635 Abnormal weight gain: Secondary | ICD-10-CM

## 2010-04-29 DIAGNOSIS — I10 Essential (primary) hypertension: Secondary | ICD-10-CM

## 2010-04-29 DIAGNOSIS — E538 Deficiency of other specified B group vitamins: Secondary | ICD-10-CM

## 2010-04-29 DIAGNOSIS — K219 Gastro-esophageal reflux disease without esophagitis: Secondary | ICD-10-CM

## 2010-04-29 LAB — CBC WITH DIFFERENTIAL/PLATELET
Basophils Relative: 0.3 % (ref 0.0–3.0)
Eosinophils Relative: 3.6 % (ref 0.0–5.0)
Hemoglobin: 12.7 g/dL (ref 12.0–15.0)
Lymphocytes Relative: 31.2 % (ref 12.0–46.0)
MCHC: 34.3 g/dL (ref 30.0–36.0)
Monocytes Relative: 7.6 % (ref 3.0–12.0)
Neutro Abs: 2.7 10*3/uL (ref 1.4–7.7)
RBC: 4.04 Mil/uL (ref 3.87–5.11)

## 2010-04-29 LAB — IRON: Iron: 56 ug/dL (ref 42–145)

## 2010-04-29 MED ORDER — CYANOCOBALAMIN 1000 MCG/ML IJ SOLN
1000.0000 ug | Freq: Once | INTRAMUSCULAR | Status: AC
  Administered 2010-04-29: 1000 ug via INTRAMUSCULAR

## 2010-04-29 NOTE — Assessment & Plan Note (Signed)
New diagnosis of mild anemia we will obtain iron levels to see if this is nutritional and a reticulocyte count as well as give the patient stool cards to make sure that there is no blood loss

## 2010-04-29 NOTE — Assessment & Plan Note (Signed)
Reviewed weight loss and discussion of diet plan

## 2010-04-29 NOTE — Progress Notes (Signed)
Subjective:    Patient ID: Kelly Robles, female    DOB: 03/17/1945, 65 y.o.   MRN: [id number]  HPI Presents for a CPX   Review of Systems  Constitutional: Negative for activity change, appetite change and fatigue.  HENT: Negative for ear pain, congestion, neck pain, postnasal drip and sinus pressure.   Eyes: Negative for redness and visual disturbance.  Respiratory: Negative for cough, shortness of breath and wheezing.   Gastrointestinal: Negative for abdominal pain and abdominal distention.  Genitourinary: Negative for dysuria, frequency and menstrual problem.  Musculoskeletal: Negative for myalgias, joint swelling and arthralgias.  Skin: Negative for rash and wound.  Neurological: Negative for dizziness, weakness and headaches.  Hematological: Negative for adenopathy. Does not bruise/bleed easily.  Psychiatric/Behavioral: Negative for sleep disturbance and decreased concentration.   Past Medical History  Diagnosis Date  . GERD (gastroesophageal reflux disease)   . Allergy   . Hypertension   . Raynaud's disease   . DLE (discoid lupus erythematosus)   . Osteoporosis    Past Surgical History  Procedure Date  . Cholecystectomy   . Tonsillectomy     reports that she quit smoking about 25 years ago. She does not have any smokeless tobacco history on file. She reports that she drinks alcohol. She reports that she does not use illicit drugs. family history includes Arthritis in her mother; Cancer in her father; and Osteoporosis in an unspecified family member. Allergies  Allergen Reactions  . Bupropion Hcl     REACTION: Rash  . Sulfonamide Derivatives     REACTION: Rash    Objective:   Physical Exam  Constitutional: She is oriented to person, place, and time. She appears well-developed and well-nourished. No distress.  HENT:  Head: Normocephalic and atraumatic.  Right Ear: External ear normal.  Left Ear: External ear normal.  Nose: Nose normal.  Mouth/Throat:  Oropharynx is clear and moist.  Eyes: Conjunctivae and EOM are normal. Pupils are equal, round, and reactive to light.  Neck: Normal range of motion. Neck supple. No JVD present. No tracheal deviation present. No thyromegaly present.  Cardiovascular: Normal rate, regular rhythm, normal heart sounds and intact distal pulses.   No murmur heard. Pulmonary/Chest: Effort normal and breath sounds normal. She has no wheezes. She exhibits no tenderness.  Abdominal: Soft. Bowel sounds are normal.  Musculoskeletal: Normal range of motion. She exhibits no edema and no tenderness.  Lymphadenopathy:    She has no cervical adenopathy.  Neurological: She is alert and oriented to person, place, and time. She has normal reflexes. No cranial nerve deficit.  Skin: Skin is warm and dry. She is not diaphoretic.  Psychiatric: She has a normal mood and affect. Her behavior is normal.          Assessment & Plan:   This is a routine physical examination for this healthy  Female. Reviewed all health maintenance protocols including mammography colonoscopy bone density and reviewed appropriate screening labs. Her immunization history was reviewed as well as her current medications and allergies refills of her chronic medications were given and the plan for yearly health maintenance was discussed all orders and referrals were made as appropriate. The patient has anemia of a new onset type her B12 levels are normal I suspect that this anemia may be due to an iron deficiency that could be dietary as well as could be blood loss we will give her stool cards we'll obtain a CBC iron and differential today to confirm the anemia her stool cards  are positive she'll be referred to gastroenterology for EGD given her history of GERD

## 2010-04-29 NOTE — Assessment & Plan Note (Signed)
Patient has not detected any change in the color of her stools but she has a mild anemia that may be resulting from blood loss or dietary deficiency and iron her B12 level is in the 500 range so I do not suspect this to contribute to her anemia we'll give her stool cards and if there is a positive occult blood would recommend an EGD

## 2010-05-07 ENCOUNTER — Other Ambulatory Visit: Payer: BC Managed Care – PPO

## 2010-06-03 ENCOUNTER — Ambulatory Visit (INDEPENDENT_AMBULATORY_CARE_PROVIDER_SITE_OTHER): Payer: Medicare Other | Admitting: Internal Medicine

## 2010-06-03 DIAGNOSIS — E538 Deficiency of other specified B group vitamins: Secondary | ICD-10-CM

## 2010-06-03 MED ORDER — CYANOCOBALAMIN 1000 MCG/ML IJ SOLN
1000.0000 ug | Freq: Once | INTRAMUSCULAR | Status: AC
  Administered 2010-06-03: 1000 ug via INTRAMUSCULAR

## 2010-06-28 ENCOUNTER — Other Ambulatory Visit: Payer: Self-pay | Admitting: *Deleted

## 2010-06-28 MED ORDER — ESOMEPRAZOLE MAGNESIUM 40 MG PO CPDR: 40.0000 mg | DELAYED_RELEASE_CAPSULE | Freq: Every day | ORAL | Status: DC

## 2010-07-05 ENCOUNTER — Ambulatory Visit (INDEPENDENT_AMBULATORY_CARE_PROVIDER_SITE_OTHER): Payer: Medicare Other | Admitting: Internal Medicine

## 2010-07-05 DIAGNOSIS — D518 Other vitamin B12 deficiency anemias: Secondary | ICD-10-CM

## 2010-07-05 DIAGNOSIS — D519 Vitamin B12 deficiency anemia, unspecified: Secondary | ICD-10-CM

## 2010-07-05 MED ORDER — CYANOCOBALAMIN 1000 MCG PO TABS: 1500.0000 ug | ORAL_TABLET | Freq: Every day | ORAL | Status: DC

## 2010-07-05 MED ORDER — CYANOCOBALAMIN 1000 MCG/ML IJ SOLN
1000.0000 ug | Freq: Once | INTRAMUSCULAR | Status: AC
  Administered 2010-07-05: 1000 ug via INTRAMUSCULAR

## 2010-07-30 ENCOUNTER — Ambulatory Visit: Payer: BC Managed Care – PPO | Admitting: Internal Medicine

## 2010-08-02 ENCOUNTER — Ambulatory Visit (INDEPENDENT_AMBULATORY_CARE_PROVIDER_SITE_OTHER): Payer: Medicare Other | Admitting: Internal Medicine

## 2010-08-02 DIAGNOSIS — D519 Vitamin B12 deficiency anemia, unspecified: Secondary | ICD-10-CM

## 2010-08-02 DIAGNOSIS — D518 Other vitamin B12 deficiency anemias: Secondary | ICD-10-CM

## 2010-08-02 MED ORDER — CYANOCOBALAMIN 1000 MCG/ML IJ SOLN
1000.0000 ug | INTRAMUSCULAR | Status: DC
  Administered 2010-08-02 – 2010-10-17 (×3): 1000 ug via INTRAMUSCULAR

## 2010-09-09 ENCOUNTER — Ambulatory Visit: Payer: Self-pay | Admitting: Internal Medicine

## 2010-09-17 ENCOUNTER — Ambulatory Visit (INDEPENDENT_AMBULATORY_CARE_PROVIDER_SITE_OTHER): Payer: Medicare Other | Admitting: *Deleted

## 2010-09-17 DIAGNOSIS — D649 Anemia, unspecified: Secondary | ICD-10-CM

## 2010-09-17 DIAGNOSIS — D518 Other vitamin B12 deficiency anemias: Secondary | ICD-10-CM

## 2010-09-29 ENCOUNTER — Other Ambulatory Visit: Payer: Self-pay | Admitting: Internal Medicine

## 2010-10-17 ENCOUNTER — Ambulatory Visit (INDEPENDENT_AMBULATORY_CARE_PROVIDER_SITE_OTHER): Payer: Medicare Other | Admitting: Internal Medicine

## 2010-10-17 DIAGNOSIS — E538 Deficiency of other specified B group vitamins: Secondary | ICD-10-CM

## 2010-10-17 DIAGNOSIS — D518 Other vitamin B12 deficiency anemias: Secondary | ICD-10-CM

## 2010-10-17 MED ORDER — CYANOCOBALAMIN 1000 MCG/ML IJ SOLN: 1000.0000 ug | Freq: Once | INTRAMUSCULAR | Status: DC

## 2010-10-28 ENCOUNTER — Ambulatory Visit: Payer: Self-pay | Admitting: Internal Medicine

## 2010-11-14 ENCOUNTER — Ambulatory Visit (INDEPENDENT_AMBULATORY_CARE_PROVIDER_SITE_OTHER): Payer: Medicare Other | Admitting: Internal Medicine

## 2010-11-14 DIAGNOSIS — D518 Other vitamin B12 deficiency anemias: Secondary | ICD-10-CM

## 2010-11-14 DIAGNOSIS — D519 Vitamin B12 deficiency anemia, unspecified: Secondary | ICD-10-CM

## 2010-11-14 MED ORDER — CYANOCOBALAMIN 1000 MCG/ML IJ SOLN
1000.0000 ug | INTRAMUSCULAR | Status: DC
  Administered 2010-11-14: 1500 ug via INTRAMUSCULAR

## 2010-11-24 ENCOUNTER — Other Ambulatory Visit: Payer: Self-pay | Admitting: Internal Medicine

## 2010-12-12 ENCOUNTER — Ambulatory Visit (INDEPENDENT_AMBULATORY_CARE_PROVIDER_SITE_OTHER): Payer: Medicare Other | Admitting: Internal Medicine

## 2010-12-12 ENCOUNTER — Encounter: Payer: Self-pay | Admitting: Internal Medicine

## 2010-12-12 VITALS — BP 132/80 | HR 72 | Temp 98.3°F | Resp 16 | Ht 64.5 in | Wt 170.0 lb

## 2010-12-12 DIAGNOSIS — F411 Generalized anxiety disorder: Secondary | ICD-10-CM

## 2010-12-12 DIAGNOSIS — E538 Deficiency of other specified B group vitamins: Secondary | ICD-10-CM

## 2010-12-12 DIAGNOSIS — T887XXA Unspecified adverse effect of drug or medicament, initial encounter: Secondary | ICD-10-CM

## 2010-12-12 DIAGNOSIS — F419 Anxiety disorder, unspecified: Secondary | ICD-10-CM

## 2010-12-12 DIAGNOSIS — E559 Vitamin D deficiency, unspecified: Secondary | ICD-10-CM

## 2010-12-12 DIAGNOSIS — E785 Hyperlipidemia, unspecified: Secondary | ICD-10-CM

## 2010-12-12 DIAGNOSIS — I10 Essential (primary) hypertension: Secondary | ICD-10-CM

## 2010-12-12 LAB — CBC WITH DIFFERENTIAL/PLATELET
Basophils Relative: 0.3 % (ref 0.0–3.0)
Eosinophils Absolute: 0.1 10*3/uL (ref 0.0–0.7)
MCHC: 33.6 g/dL (ref 30.0–36.0)
MCV: 91.8 fl (ref 78.0–100.0)
Monocytes Absolute: 0.5 10*3/uL (ref 0.1–1.0)
Neutrophils Relative %: 65.3 % (ref 43.0–77.0)
RBC: 4.17 Mil/uL (ref 3.87–5.11)

## 2010-12-12 LAB — HEPATIC FUNCTION PANEL
AST: 18 U/L (ref 0–37)
Albumin: 3.6 g/dL (ref 3.5–5.2)
Alkaline Phosphatase: 49 U/L (ref 39–117)

## 2010-12-12 LAB — LIPID PANEL
Cholesterol: 202 mg/dL — ABNORMAL HIGH (ref 0–200)
Total CHOL/HDL Ratio: 3

## 2010-12-12 MED ORDER — CYANOCOBALAMIN 1000 MCG/ML IJ SOLN
1000.0000 ug | INTRAMUSCULAR | Status: DC
  Administered 2010-12-12: 1000 ug via INTRAMUSCULAR

## 2010-12-12 NOTE — Patient Instructions (Signed)
The patient is instructed to continue all medications as prescribed. Schedule followup with check out clerk upon leaving the clinic  

## 2010-12-13 ENCOUNTER — Telehealth: Payer: Self-pay | Admitting: Family Medicine

## 2010-12-13 NOTE — Telephone Encounter (Signed)
Pt left vmail about bill & question on DOS 2.2.12. Called pt & LMOM

## 2010-12-15 ENCOUNTER — Encounter: Payer: Self-pay | Admitting: Internal Medicine

## 2010-12-15 NOTE — Progress Notes (Signed)
Subjective:    Patient ID: Kelly Robles, female    DOB: 1945/03/12, 65 y.o.   MRN: [id number]  HPI  Patient presents for followup of hyperlipidemia gastroesophageal reflux hypertension and osteoarthritis she states that she has converted to a vegetarian diet and has lost significant weight her blood pressure has responded and she states that she feels increased energy.  She does have a history of B12 deficiency and anemia but should be monitored as well as a history of hyperlipidemia that needs to be monitored  Review of Systems  Constitutional: Negative for activity change, appetite change and fatigue.  HENT: Negative for ear pain, congestion, neck pain, postnasal drip and sinus pressure.   Eyes: Negative for redness and visual disturbance.  Respiratory: Negative for cough, shortness of breath and wheezing.   Gastrointestinal: Negative for abdominal pain and abdominal distention.  Genitourinary: Negative for dysuria, frequency and menstrual problem.  Musculoskeletal: Negative for myalgias, joint swelling and arthralgias.  Skin: Negative for rash and wound.  Neurological: Negative for dizziness, weakness and headaches.  Hematological: Negative for adenopathy. Does not bruise/bleed easily.  Psychiatric/Behavioral: Negative for sleep disturbance and decreased concentration.   Past Medical History  Diagnosis Date  . GERD (gastroesophageal reflux disease)   . Allergy   . Hypertension   . Raynaud's disease   . DLE (discoid lupus erythematosus)   . Osteoporosis     History   Social History  . Marital Status: Married    Spouse Name: N/A    Number of Children: N/A  . Years of Education: N/A   Occupational History  . Not on file.   Social History Main Topics  . Smoking status: Former Smoker    Quit date: 01/13/1985  . Smokeless tobacco: Not on file  . Alcohol Use: Yes  . Drug Use: No  . Sexually Active: Not on file   Other Topics Concern  . Not on file   Social  History Narrative  . No narrative on file    Past Surgical History  Procedure Date  . Cholecystectomy   . Tonsillectomy     Family History  Problem Relation Age of Onset  . Osteoporosis    . Arthritis Mother   . Cancer Father     Allergies  Allergen Reactions  . Bupropion Hcl     REACTION: Rash  . Sulfonamide Derivatives     REACTION: Rash    Current Outpatient Prescriptions on File Prior to Visit  Medication Sig Dispense Refill  . Artificial Saliva (BIOTENE ORALBALANCE DRY MOUTH) LIQD Use as directed in the mouth or throat daily.        Marland Kitchen aspirin 81 MG EC tablet Take 81 mg by mouth daily.        . calcium-vitamin D (OSCAL) 250-125 MG-UNIT per tablet Take 1 tablet by mouth daily.        . carvedilol (COREG) 6.25 MG tablet TAKE 1 TABLET TWICE A DAY  60 tablet  1  . Cyanocobalamin (VITAMIN B-12 IJ) Inject as directed. Pt takes 1500 mcg /1.6ml monthly       . esomeprazole (NEXIUM) 40 MG capsule Take 1 capsule (40 mg total) by mouth daily before breakfast.  90 capsule  3  . estradiol (ESTRACE) 2 MG tablet Take 2 mg by mouth daily.        . hydroxychloroquine (PLAQUENIL) 200 MG tablet Take by mouth daily.        . multivitamin (THERAGRAN) per tablet Take 1 tablet by mouth daily.        Marland Kitchen  progesterone (PROMETRIUM) 200 MG capsule Take 200 mg by mouth. 1-12 DAYS  During cycle       . S-Adenosylmethionine (SAM-E) 400 MG TABS Take by mouth.        . vitamin E 400 UNIT capsule Take 400 Units by mouth daily.          BP 132/80  Pulse 72  Temp 98.3 F (36.8 C)  Resp 16  Ht 5' 4.5" (1.638 m)  Wt 170 lb (77.111 kg)  BMI 28.73 kg/m2        Objective:   Physical Exam  Nursing note and vitals reviewed. Constitutional: She is oriented to person, place, and time. She appears well-developed and well-nourished. No distress.  HENT:  Head: Normocephalic and atraumatic.  Right Ear: External ear normal.  Left Ear: External ear normal.  Nose: Nose normal.  Mouth/Throat: Oropharynx  is clear and moist.  Eyes: Conjunctivae and EOM are normal. Pupils are equal, round, and reactive to light.  Neck: Normal range of motion. Neck supple. No JVD present. No tracheal deviation present. No thyromegaly present.  Cardiovascular: Normal rate, regular rhythm, normal heart sounds and intact distal pulses.   No murmur heard. Pulmonary/Chest: Effort normal and breath sounds normal. She has no wheezes. She exhibits no tenderness.  Abdominal: Soft. Bowel sounds are normal.  Musculoskeletal: Normal range of motion. She exhibits no edema and no tenderness.  Lymphadenopathy:    She has no cervical adenopathy.  Neurological: She is alert and oriented to person, place, and time. She has normal reflexes. No cranial nerve deficit.  Skin: Skin is warm and dry. She is not diaphoretic.  Psychiatric: She has a normal mood and affect. Her behavior is normal.          Assessment & Plan:  We'll monitor her lipid profile on a vegetarian diet The pressure is stable and improved with her weight loss.  She has had no further palpitations She has no symptoms of anemia in fact her energy is markedly improved

## 2011-01-12 IMAGING — US US SOFT TISSUE HEAD/NECK
1 series · 14 of 25 positions shown · non-contrast
Comparison: None

CLINICAL DATA: Thyroid nodule felt on physical exam

THYROID ULTRASOUND
TECHNIQUE: Ultrasound examination of the thyroid gland and
adjacent soft tissues was performed.

[Series 1: us soft tissue head/neck · 0.08mm/px · 14 of 55 slices shown]
[im 1/55]
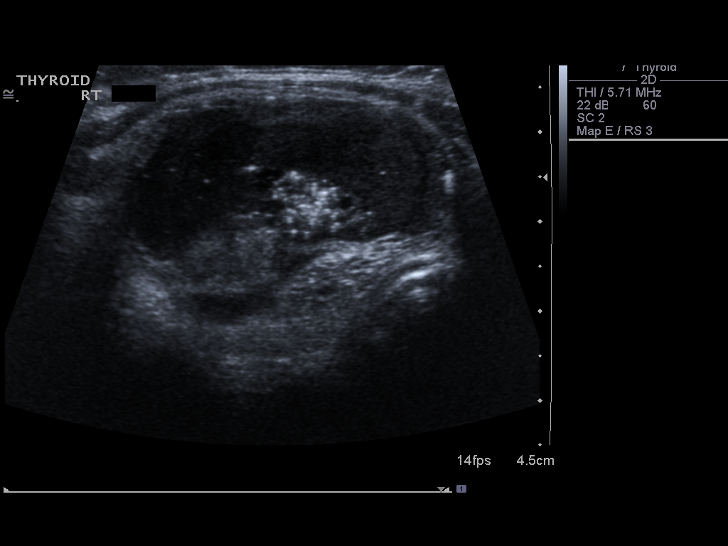
[im 5/55]
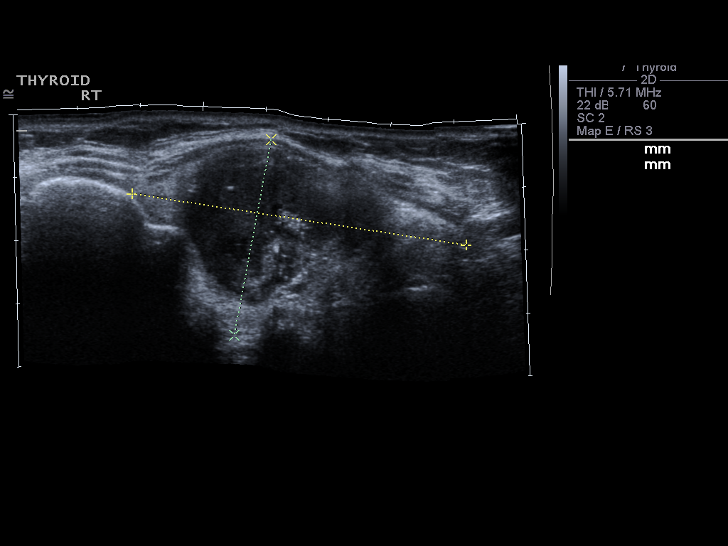
[im 10/55]
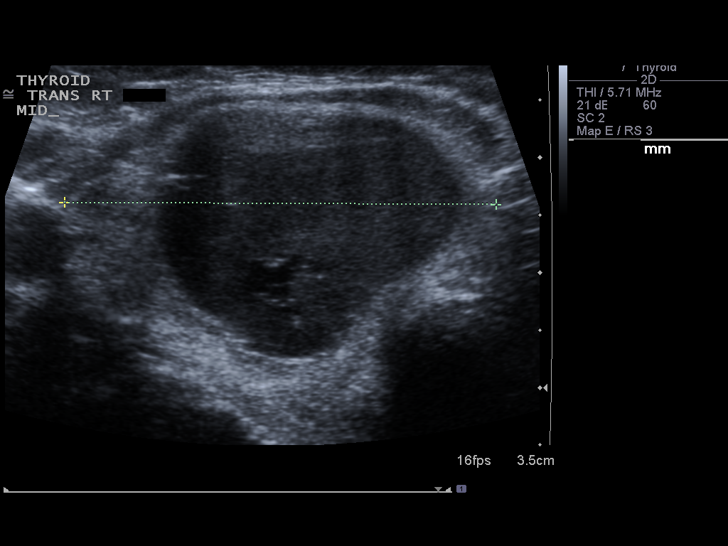
[im 14/55]
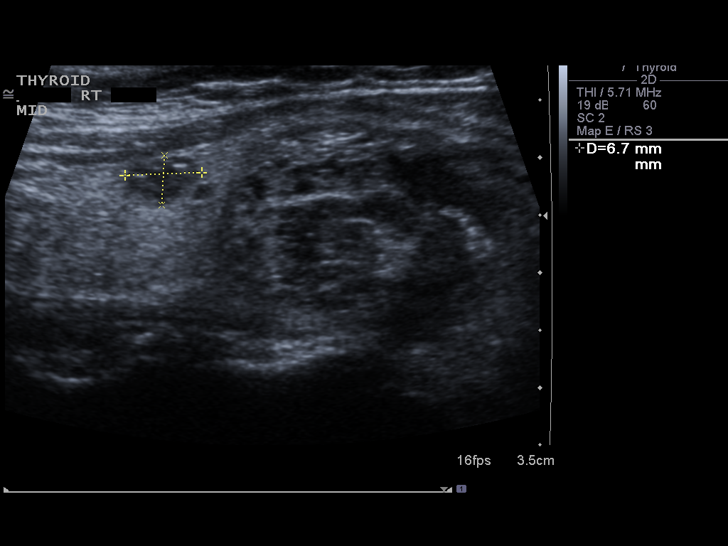
[im 19/55]
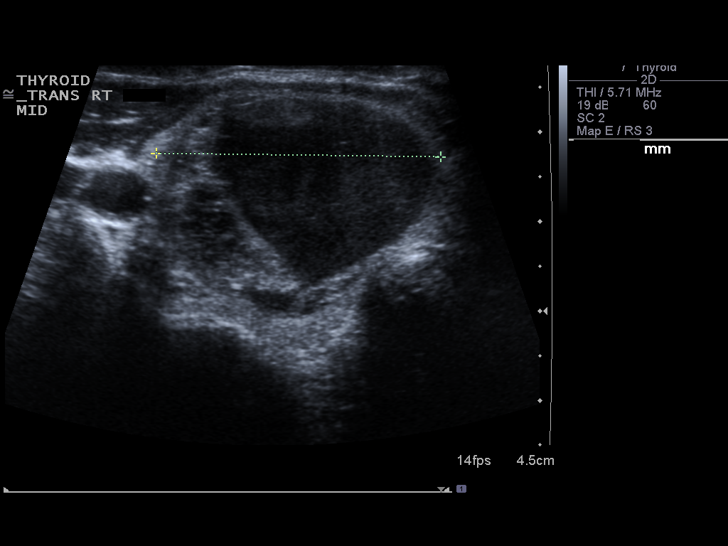
[im 21/55]
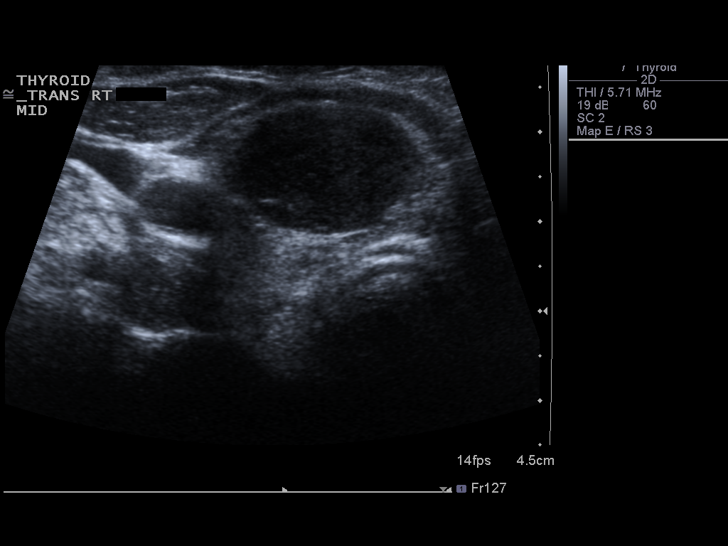
[im 25/55]
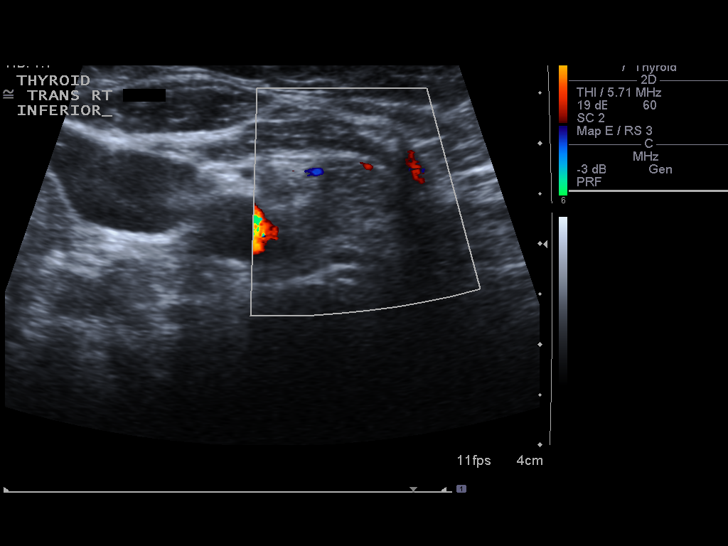
[im 30/55]
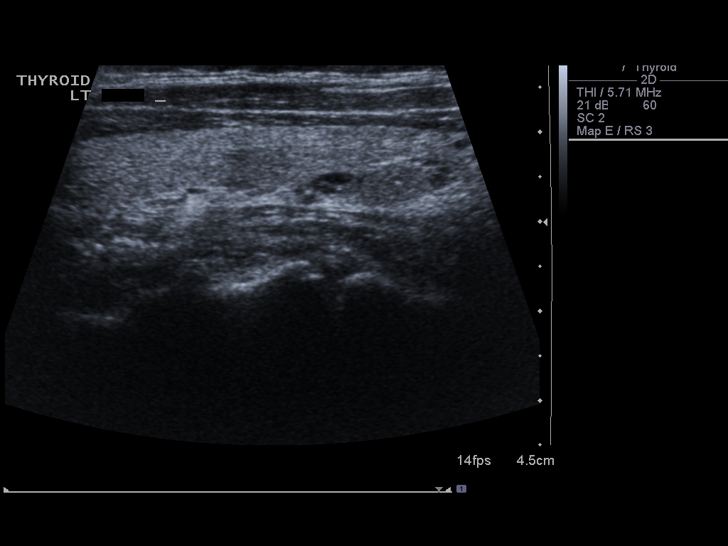
[im 34/55]
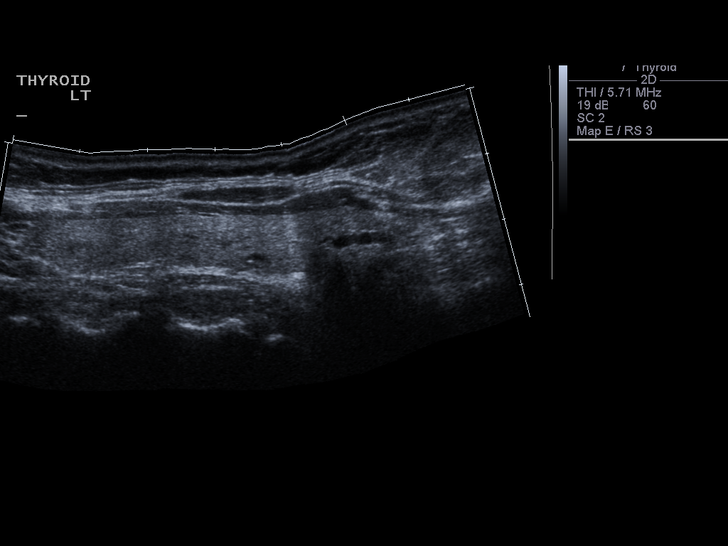
[im 37/55]
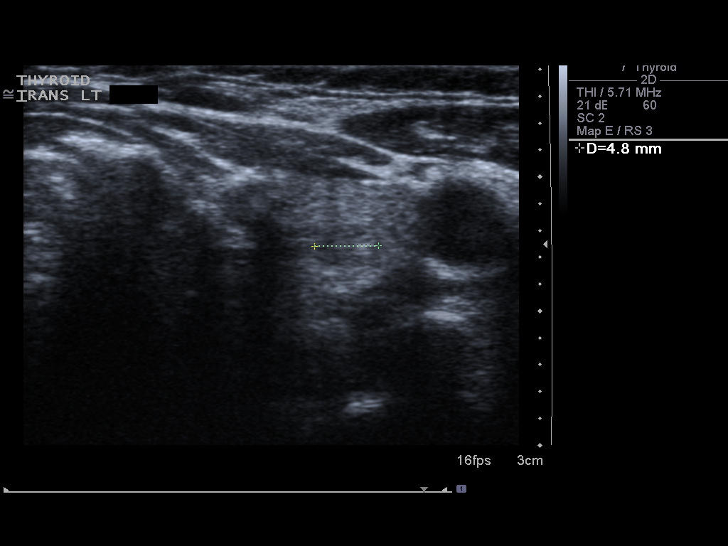
[im 41/55]
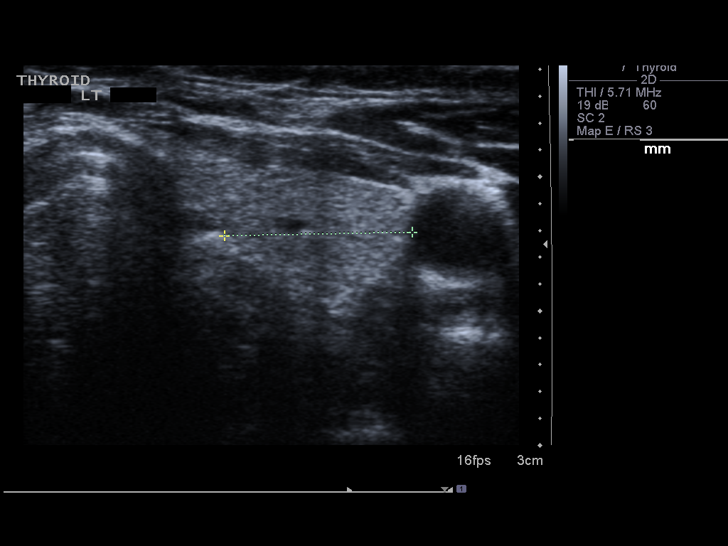
[im 46/55]
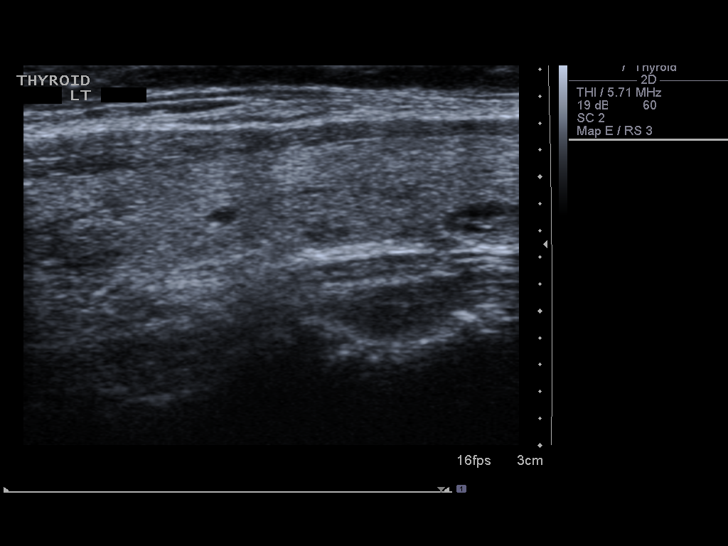
[im 50/55]
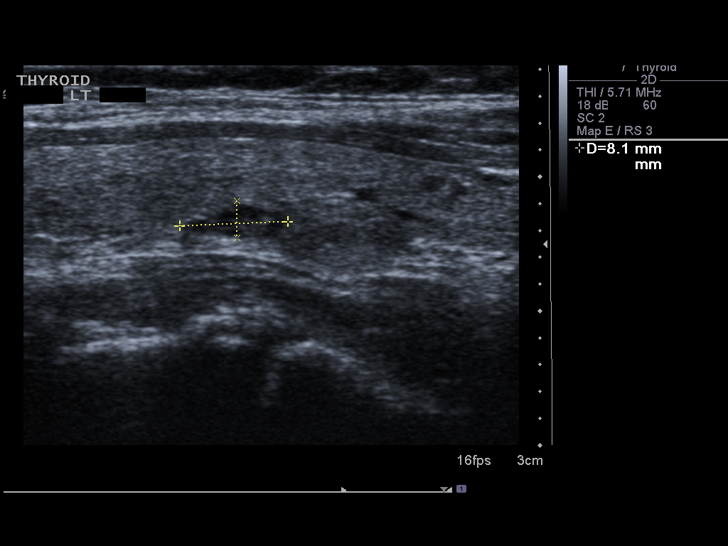
[im 55/55]
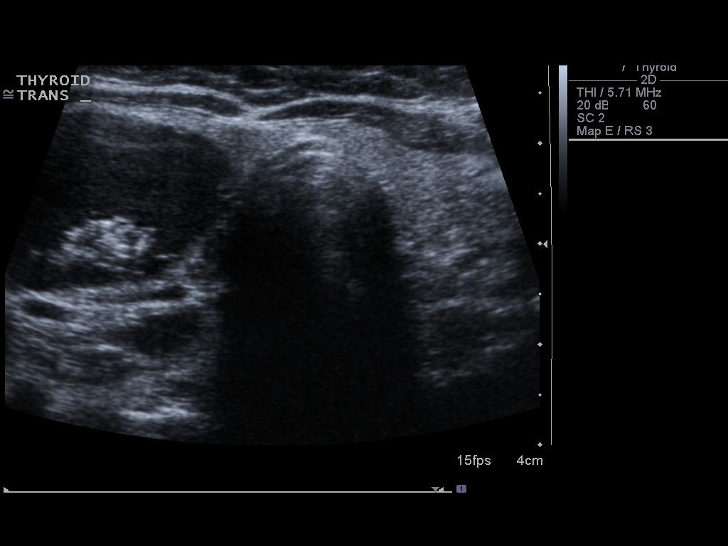

[14 of 25 positions shown; findings below may reference images not displayed]

FINDINGS: The right lobe of thyroid is enlarged, primarily as a
result of a complex nodule in the mid lower right lobe of 3.8 x
x 3.2 cm.  Biopsy of this complex nodule is recommended.  The right
lobe measures 6.3 cm sagittally, with a depth of 3.1 cm and width
of 3.8 cm.  Two smaller solid nodules are noted on the right, one
in the lower pole of 10 x 7 x 9 mm, and a second in the right upper
lobe of 7 x 4 x 5 mm.

The left lobe measures 5.4 x 0.8 x 1.4 cm.  There are small nodules
noted of no more than 8 mm in diameter.
IMPRESSION: The right lobe is enlarged primarily as a result of a dominant
complex nodule of 3.8 x 2.5 x 3.2 cm.  Recommend biopsy of this
complex nodule.

## 2011-01-17 ENCOUNTER — Telehealth: Payer: Self-pay | Admitting: Family Medicine

## 2011-01-17 NOTE — Telephone Encounter (Signed)
Pt is coming Monday for B12. Kelly Robles and husband Jonny Ruiz Finnigan DOB [birth year]) had labs about a month ago. They would like the results, and Kelly Robles is wondering if Kelly Robles can pick them up Monday.

## 2011-01-17 NOTE — Telephone Encounter (Signed)
Up front ready for pick up

## 2011-01-19 IMAGING — US US BIOPSY
1 series · 11 of 11 positions shown · non-contrast
Comparison: none

Clinical Data/Indication: Dominant right thyroid nodule

ULTRASOUND-GUIDED BIOPSYOF A DOMINANT RIGHT THYROID NODULE.  FINE
NEEDLE ASPIRATION.
Procedure: The procedure, risks, benefits, and alternatives were
explained to the patient. Questions regarding the procedure were
encouraged and answered. The patient understands and consents to
the procedure.
The right neck was prepped withbetadine in a sterile fasion, and a
sterile drape was applied covering the operative field. A sterile
gown and sterile gloves were used for the procedure.
Under sonographic guidance, three 25 gauge fine needle aspirates of
the dominant right thyroid nodule were obtained. A 21 gauge needle
was then inserted and 15 ml of dark reddish fluid was aspirated
decompressing the complex nodule.  Final imaging was performed.
Patient tolerated the procedure well without complication.  Vital
sign monitoring by nursing staff during the procedure will continue
as patient is in the special procedures unit for post procedure
observation.

[Series 1: us biopsy · 0.08mm/px · 11 acquisitions, 11 frames shown]
[im 1/11]
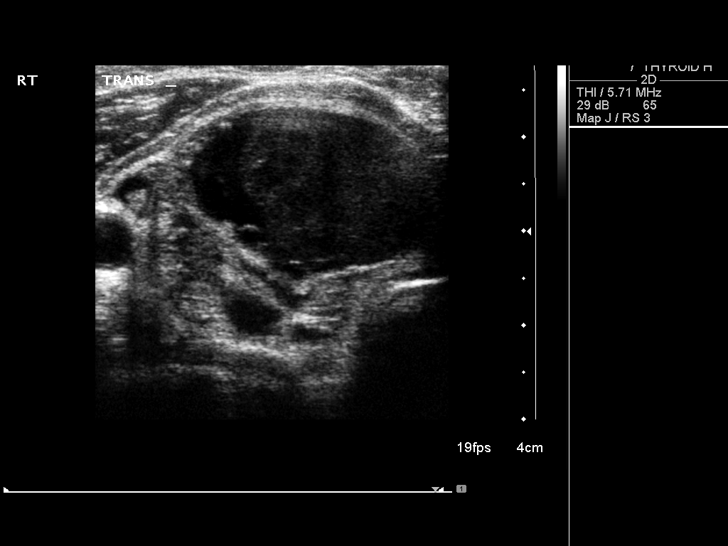
[im 2/11]
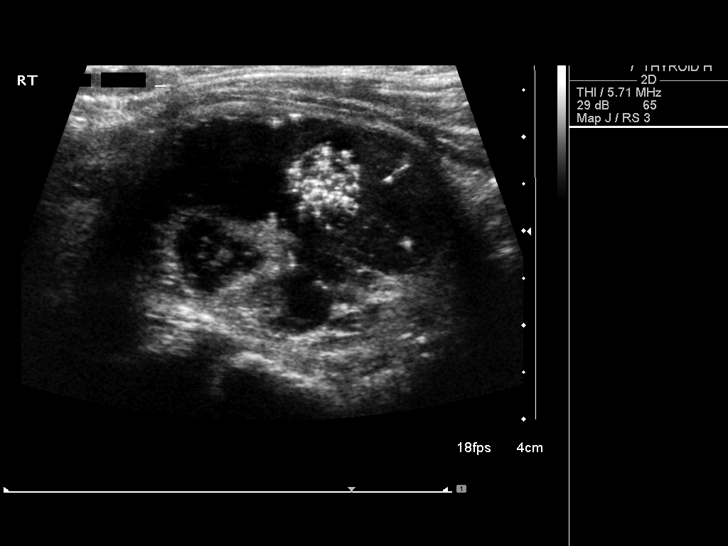
[im 3/11]
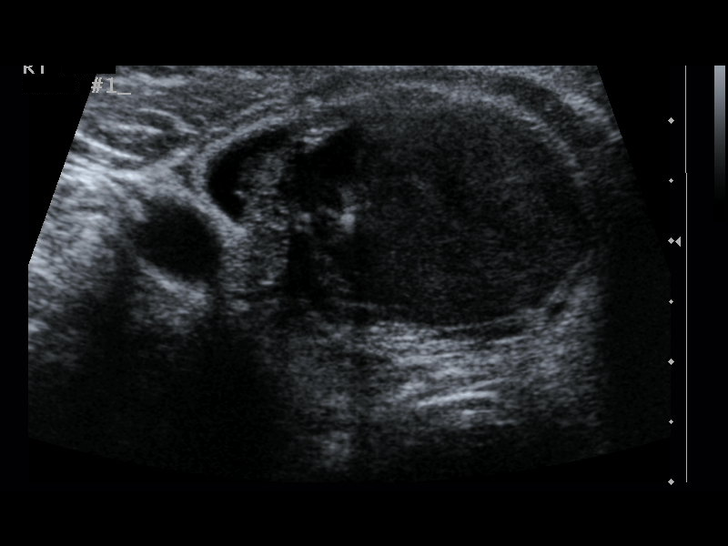
[im 4/11]
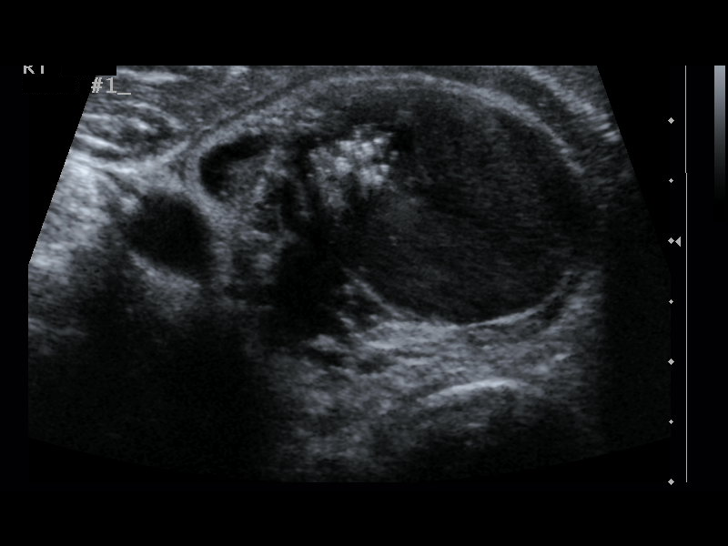
[im 5/11]
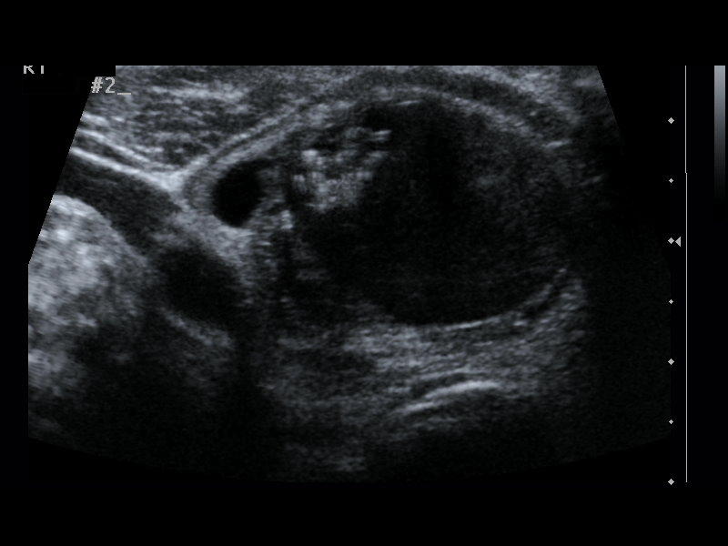
[im 6/11]
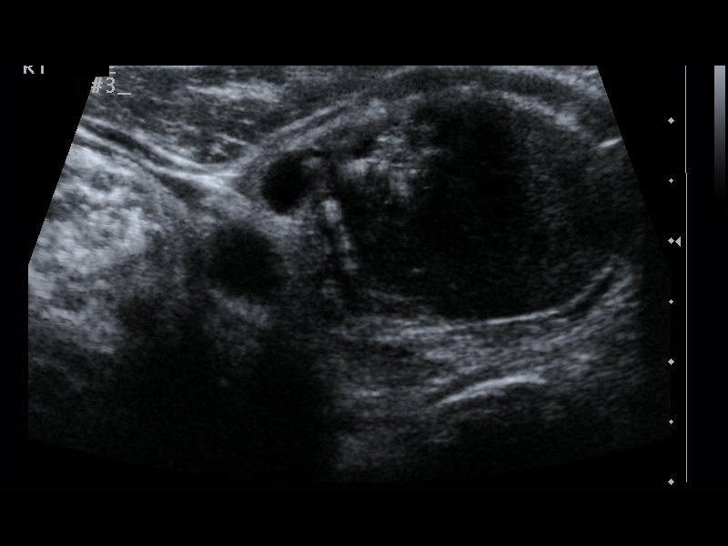
[im 7/11]
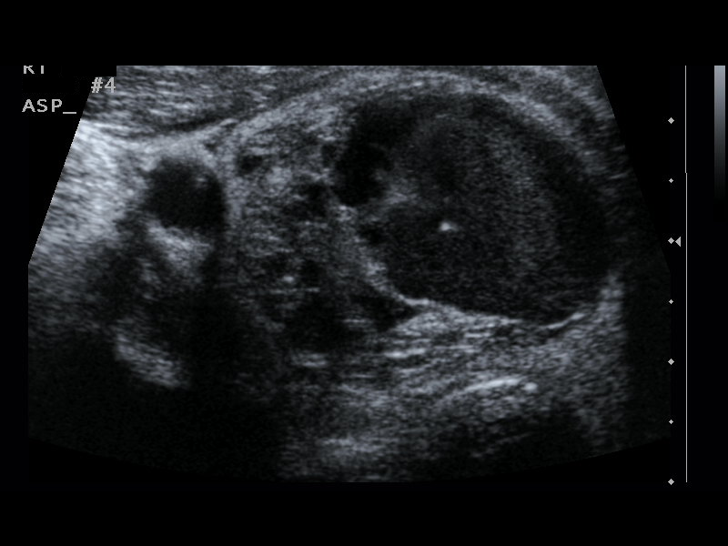
[im 8/11]
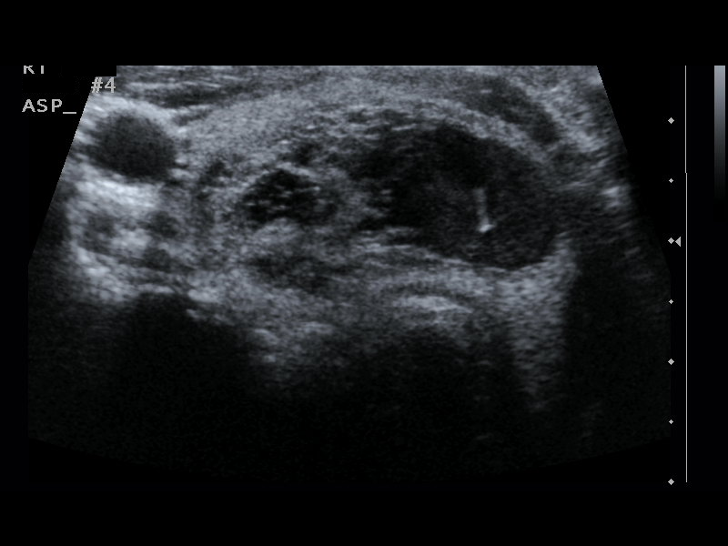
[im 9/11]
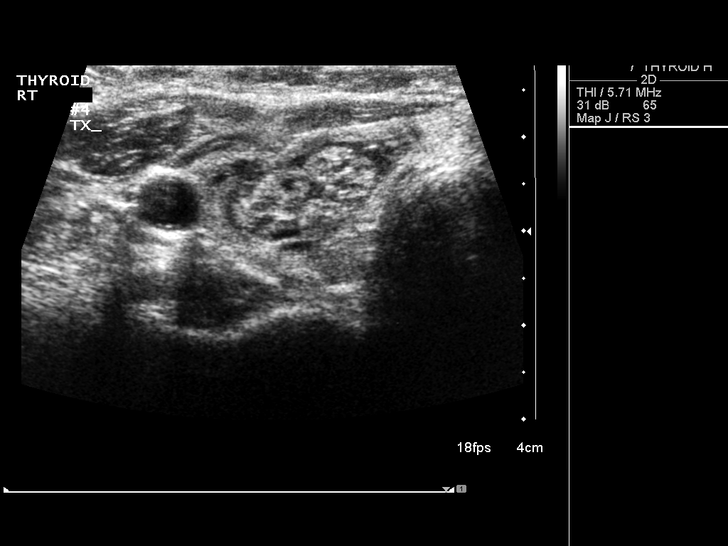
[im 10/11]
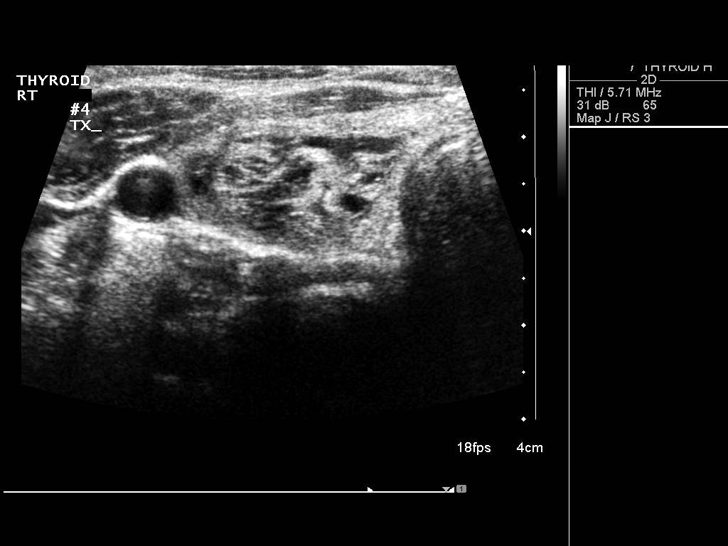
[im 11/11]
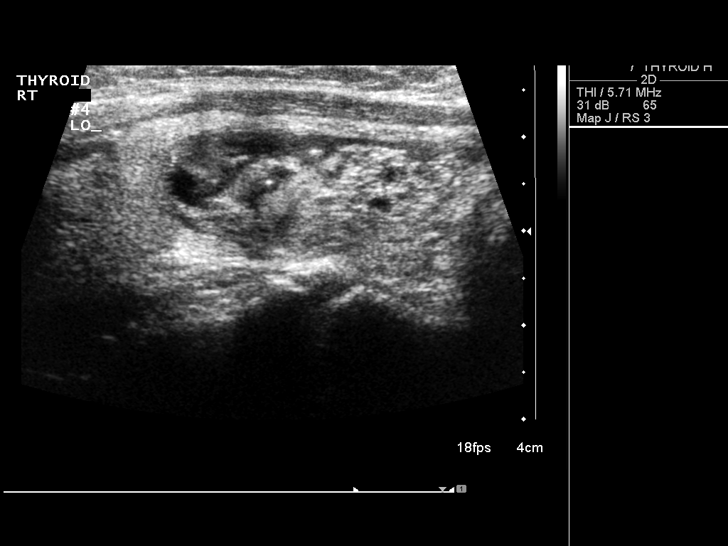

[11 of 11 positions shown; findings below may reference images not displayed]

FINDINGS: The images document guide needle placement within the
dominant right thyroid nodule. Post biopsy images demonstrate no
hemorrhage and decompression of the cystic portion.
IMPRESSION: Successful ultrasound-guided fine needle aspiration of a dominant
right thyroid nodule.

## 2011-01-20 ENCOUNTER — Ambulatory Visit (INDEPENDENT_AMBULATORY_CARE_PROVIDER_SITE_OTHER): Payer: Medicare Other | Admitting: Internal Medicine

## 2011-01-20 DIAGNOSIS — E538 Deficiency of other specified B group vitamins: Secondary | ICD-10-CM

## 2011-01-20 MED ORDER — CYANOCOBALAMIN 1000 MCG/ML IJ SOLN
1000.0000 ug | INTRAMUSCULAR | Status: DC
  Administered 2011-01-20: 1500 ug via INTRAMUSCULAR

## 2011-01-28 ENCOUNTER — Other Ambulatory Visit: Payer: Self-pay | Admitting: Internal Medicine

## 2011-02-21 ENCOUNTER — Ambulatory Visit (INDEPENDENT_AMBULATORY_CARE_PROVIDER_SITE_OTHER): Payer: Medicare Other | Admitting: Internal Medicine

## 2011-02-21 DIAGNOSIS — D518 Other vitamin B12 deficiency anemias: Secondary | ICD-10-CM

## 2011-02-21 DIAGNOSIS — D519 Vitamin B12 deficiency anemia, unspecified: Secondary | ICD-10-CM

## 2011-02-21 MED ORDER — CYANOCOBALAMIN 1000 MCG/ML IJ SOLN
1000.0000 ug | Freq: Once | INTRAMUSCULAR | Status: AC
  Administered 2011-02-21: 1000 ug via INTRAMUSCULAR

## 2011-03-17 ENCOUNTER — Ambulatory Visit (INDEPENDENT_AMBULATORY_CARE_PROVIDER_SITE_OTHER): Payer: Medicare Other | Admitting: Internal Medicine

## 2011-03-17 DIAGNOSIS — D519 Vitamin B12 deficiency anemia, unspecified: Secondary | ICD-10-CM

## 2011-03-17 DIAGNOSIS — D518 Other vitamin B12 deficiency anemias: Secondary | ICD-10-CM

## 2011-03-17 MED ORDER — CYANOCOBALAMIN 1000 MCG/ML IJ SOLN
1000.0000 ug | INTRAMUSCULAR | Status: DC
  Administered 2011-03-17: 1500 ug via INTRAMUSCULAR

## 2011-03-26 ENCOUNTER — Other Ambulatory Visit: Payer: Self-pay | Admitting: Internal Medicine

## 2011-04-08 ENCOUNTER — Ambulatory Visit: Payer: Medicare Other | Admitting: Internal Medicine

## 2011-04-14 ENCOUNTER — Ambulatory Visit: Payer: Medicare Other | Admitting: Internal Medicine

## 2011-04-22 ENCOUNTER — Ambulatory Visit (INDEPENDENT_AMBULATORY_CARE_PROVIDER_SITE_OTHER): Payer: Medicare Other | Admitting: Internal Medicine

## 2011-04-22 DIAGNOSIS — D518 Other vitamin B12 deficiency anemias: Secondary | ICD-10-CM

## 2011-04-22 DIAGNOSIS — D519 Vitamin B12 deficiency anemia, unspecified: Secondary | ICD-10-CM

## 2011-04-25 MED ORDER — CYANOCOBALAMIN 1000 MCG/ML IJ SOLN: 1500.0000 ug | INTRAMUSCULAR | Status: DC

## 2011-05-19 ENCOUNTER — Encounter: Payer: Self-pay | Admitting: Internal Medicine

## 2011-05-19 ENCOUNTER — Ambulatory Visit (INDEPENDENT_AMBULATORY_CARE_PROVIDER_SITE_OTHER): Payer: Medicare Other | Admitting: Internal Medicine

## 2011-05-19 VITALS — BP 120/78 | HR 76 | Temp 98.2°F | Resp 16 | Ht 64.0 in | Wt 163.0 lb

## 2011-05-19 DIAGNOSIS — D518 Other vitamin B12 deficiency anemias: Secondary | ICD-10-CM

## 2011-05-19 DIAGNOSIS — R634 Abnormal weight loss: Secondary | ICD-10-CM

## 2011-05-19 DIAGNOSIS — K589 Irritable bowel syndrome without diarrhea: Secondary | ICD-10-CM

## 2011-05-19 DIAGNOSIS — E559 Vitamin D deficiency, unspecified: Secondary | ICD-10-CM

## 2011-05-19 DIAGNOSIS — K219 Gastro-esophageal reflux disease without esophagitis: Secondary | ICD-10-CM

## 2011-05-19 DIAGNOSIS — I10 Essential (primary) hypertension: Secondary | ICD-10-CM

## 2011-05-19 LAB — CBC WITH DIFFERENTIAL/PLATELET
Basophils Absolute: 0 10*3/uL (ref 0.0–0.1)
Eosinophils Relative: 1.7 % (ref 0.0–5.0)
HCT: 38.9 % (ref 36.0–46.0)
Lymphs Abs: 1.4 10*3/uL (ref 0.7–4.0)
Monocytes Absolute: 0.3 10*3/uL (ref 0.1–1.0)
Monocytes Relative: 7.4 % (ref 3.0–12.0)
Neutrophils Relative %: 57.5 % (ref 43.0–77.0)
Platelets: 171 10*3/uL (ref 150.0–400.0)
RDW: 13.8 % (ref 11.5–14.6)
WBC: 4.2 10*3/uL — ABNORMAL LOW (ref 4.5–10.5)

## 2011-05-19 LAB — HEPATIC FUNCTION PANEL
AST: 16 U/L (ref 0–37)
Albumin: 3.5 g/dL (ref 3.5–5.2)
Total Bilirubin: 0.7 mg/dL (ref 0.3–1.2)

## 2011-05-19 MED ORDER — CYANOCOBALAMIN 1000 MCG/ML IJ SOLN
1000.0000 ug | INTRAMUSCULAR | Status: DC
  Administered 2011-05-19: 1000 ug via INTRAMUSCULAR

## 2011-05-19 NOTE — Progress Notes (Signed)
  Subjective:    Patient ID: Kelly Robles, female    DOB: Sep 09, 1945, 66 y.o.   MRN: [id number]  HPI Patient is a 66 year old female is followed for hypertension discoid lupus she has a past medical history of atrial fib versus tachycardia.  She's been stable on Corag and minimal dose she has had progressive weight loss which has been planned and this is helping with her blood pressure and rate control.  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 Patient has been on a largely vegetarian diet    Review of Systems  Constitutional: Negative for activity change, appetite change and fatigue.  HENT: Negative for ear pain, congestion, neck pain, postnasal drip and sinus pressure.   Eyes: Negative for redness and visual disturbance.  Respiratory: Negative for cough, shortness of breath and wheezing.   Gastrointestinal: Negative for abdominal pain and abdominal distention.  Genitourinary: Negative for dysuria, frequency and menstrual problem.  Musculoskeletal: Negative for myalgias, joint swelling and arthralgias.  Skin: Negative for rash and wound.  Neurological: Negative for dizziness, weakness and headaches.  Hematological: Negative for adenopathy. Does not bruise/bleed easily.  Psychiatric/Behavioral: Negative for sleep disturbance and decreased concentration.       Objective:   Physical Exam  Nursing note and vitals reviewed. Constitutional: She is oriented to person, place, and time. She appears well-developed and well-nourished. No distress.  HENT:  Head: Normocephalic and atraumatic.  Right Ear: External ear normal.  Left Ear: External ear normal.  Nose: Nose normal.  Mouth/Throat: Oropharynx is clear and moist.  Eyes: Conjunctivae and EOM are normal. Pupils are equal, round, and reactive to light.  Neck: Normal range of motion. Neck supple. No JVD present. No tracheal deviation present. No thyromegaly present.    Cardiovascular: Normal rate, regular rhythm, normal heart sounds and intact distal pulses.   No murmur heard. Pulmonary/Chest: Effort normal and breath sounds normal. She has no wheezes. She exhibits no tenderness.  Abdominal: Soft. Bowel sounds are normal.  Musculoskeletal: Normal range of motion. She exhibits no edema and no tenderness.  Lymphadenopathy:    She has no cervical adenopathy.  Neurological: She is alert and oriented to person, place, and time. She has normal reflexes. No cranial nerve deficit.  Skin: Skin is warm and dry. She is not diaphoretic.  Psychiatric: She has a normal mood and affect. Her behavior is normal.          Assessment & Plan:  Excellent response to low-dose beta-blockade with good blood pressure control.  Treatment of hypertension with weight loss and exercise treatment of hyperlipidemia with largely a vegan diet Improvement of joint pain noticed.  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 Palpitations improved

## 2011-05-19 NOTE — Patient Instructions (Signed)
The patient is instructed to continue all medications as prescribed. Schedule followup with check out clerk upon leaving the clinic  

## 2011-05-20 LAB — VITAMIN D 25 HYDROXY (VIT D DEFICIENCY, FRACTURES): Vit D, 25-Hydroxy: 83 ng/mL (ref 30–89)

## 2011-05-20 NOTE — Progress Notes (Signed)
Left message on machine.

## 2011-05-22 LAB — METHYLMALONIC ACID, SERUM: Methylmalonic Acid, Quant: 0.1 umol/L (ref <0.40)

## 2011-05-23 ENCOUNTER — Other Ambulatory Visit: Payer: Self-pay | Admitting: Obstetrics & Gynecology

## 2011-05-29 ENCOUNTER — Other Ambulatory Visit: Payer: Self-pay | Admitting: Internal Medicine

## 2011-06-20 ENCOUNTER — Ambulatory Visit (INDEPENDENT_AMBULATORY_CARE_PROVIDER_SITE_OTHER): Payer: Medicare Other | Admitting: Internal Medicine

## 2011-06-20 DIAGNOSIS — E538 Deficiency of other specified B group vitamins: Secondary | ICD-10-CM

## 2011-06-20 MED ORDER — CYANOCOBALAMIN 1000 MCG/ML IJ SOLN
1000.0000 ug | INTRAMUSCULAR | Status: DC
  Administered 2011-06-20: 1000 ug via INTRAMUSCULAR
  Administered 2011-08-22: 1500 ug via INTRAMUSCULAR

## 2011-07-04 ENCOUNTER — Other Ambulatory Visit: Payer: Self-pay | Admitting: Internal Medicine

## 2011-07-21 ENCOUNTER — Ambulatory Visit: Payer: Medicare Other | Admitting: Internal Medicine

## 2011-07-22 ENCOUNTER — Ambulatory Visit (INDEPENDENT_AMBULATORY_CARE_PROVIDER_SITE_OTHER): Payer: Medicare Other | Admitting: Internal Medicine

## 2011-07-22 DIAGNOSIS — D519 Vitamin B12 deficiency anemia, unspecified: Secondary | ICD-10-CM

## 2011-07-22 DIAGNOSIS — D518 Other vitamin B12 deficiency anemias: Secondary | ICD-10-CM

## 2011-07-22 MED ORDER — CYANOCOBALAMIN 1000 MCG/ML IJ SOLN
1000.0000 ug | INTRAMUSCULAR | Status: AC
  Administered 2011-07-22: 1000 ug via INTRAMUSCULAR

## 2011-07-29 ENCOUNTER — Other Ambulatory Visit: Payer: Self-pay | Admitting: Internal Medicine

## 2011-08-22 ENCOUNTER — Encounter: Payer: Self-pay | Admitting: Internal Medicine

## 2011-08-22 ENCOUNTER — Ambulatory Visit (INDEPENDENT_AMBULATORY_CARE_PROVIDER_SITE_OTHER): Payer: Medicare Other | Admitting: Internal Medicine

## 2011-08-22 VITALS — BP 130/70 | HR 72 | Temp 98.2°F | Resp 16 | Ht 64.0 in | Wt 156.0 lb

## 2011-08-22 DIAGNOSIS — E538 Deficiency of other specified B group vitamins: Secondary | ICD-10-CM

## 2011-08-22 DIAGNOSIS — R5383 Other fatigue: Secondary | ICD-10-CM

## 2011-08-22 DIAGNOSIS — K219 Gastro-esophageal reflux disease without esophagitis: Secondary | ICD-10-CM

## 2011-08-22 DIAGNOSIS — I10 Essential (primary) hypertension: Secondary | ICD-10-CM

## 2011-08-22 MED ORDER — CYANOCOBALAMIN 1000 MCG/ML IJ SOLN: 1500.0000 ug | INTRAMUSCULAR | Status: AC

## 2011-08-22 MED ORDER — CARVEDILOL 6.25 MG PO TABS: 3.1250 mg | ORAL_TABLET | Freq: Two times a day (BID) | ORAL | Status: DC

## 2011-08-22 NOTE — Patient Instructions (Addendum)
The Nexium to every other day for one month then discontinue the Nexium and replace it with Pepcid 10 mg at bedtime

## 2011-08-22 NOTE — Progress Notes (Signed)
Subjective:    Patient ID: Kelly Robles, female    DOB: 1945/09/21, 66 y.o.   MRN: [id number]  HPI Patient presents for routine followup of hypertension gastroesophageal reflux and allergic rhinitis.  She also wishes to review recent weight loss and its effect on both her blood pressure her gastroesophageal reflux and overall cardiovascular risks    Review of Systems  Constitutional: Negative for activity change, appetite change and fatigue.  HENT: Negative for ear pain, congestion, neck pain, postnasal drip and sinus pressure.   Eyes: Negative for redness and visual disturbance.  Respiratory: Negative for cough, shortness of breath and wheezing.   Gastrointestinal: Negative for abdominal pain and abdominal distention.  Genitourinary: Negative for dysuria, frequency and menstrual problem.  Musculoskeletal: Negative for myalgias, joint swelling and arthralgias.  Skin: Negative for rash and wound.  Neurological: Negative for dizziness, weakness and headaches.  Hematological: Negative for adenopathy. Does not bruise/bleed easily.  Psychiatric/Behavioral: Negative for disturbed wake/sleep cycle and decreased concentration.   Past Medical History  Diagnosis Date  . GERD (gastroesophageal reflux disease)   . Allergy   . Hypertension   . Raynaud's disease   . DLE (discoid lupus erythematosus)   . Osteoporosis     History   Social History  . Marital Status: Married    Spouse Name: N/A    Number of Children: N/A  . Years of Education: N/A   Occupational History  . Not on file.   Social History Main Topics  . Smoking status: Former Smoker    Quit date: 01/13/1985  . Smokeless tobacco: Not on file  . Alcohol Use: Yes  . Drug Use: No  . Sexually Active: Not on file   Other Topics Concern  . Not on file   Social History Narrative  . No narrative on file    Past Surgical History  Procedure Date  . Cholecystectomy   . Tonsillectomy     Family History  Problem  Relation Age of Onset  . Osteoporosis    . Arthritis Mother   . Cancer Father     Allergies  Allergen Reactions  . Bupropion Hcl     REACTION: Rash  . Sulfonamide Derivatives     REACTION: Rash    Current Outpatient Prescriptions on File Prior to Visit  Medication Sig Dispense Refill  . Artificial Saliva (BIOTENE ORALBALANCE DRY MOUTH) LIQD Use as directed in the mouth or throat daily.        Marland Kitchen aspirin 81 MG EC tablet Take 81 mg by mouth daily.        . carvedilol (COREG) 6.25 MG tablet TAKE 1 TABLET TWICE A DAY  60 tablet  1  . cholecalciferol (VITAMIN D) 1000 UNITS tablet Take 3,000 Units by mouth daily.       Marland Kitchen estradiol (ESTRACE) 2 MG tablet Take 2 mg by mouth daily.        . hydroxychloroquine (PLAQUENIL) 200 MG tablet Take by mouth daily.        . hydrOXYzine (ATARAX/VISTARIL) 25 MG tablet Take 25 mg by mouth every 8 (eight) hours as needed.      . multivitamin (THERAGRAN) per tablet Take 1 tablet by mouth daily.        Marland Kitchen NEXIUM 40 MG capsule TAKE ONE CAPSULE BY MOUTH EVERY DAY BEFORE BREAKFAST  90 capsule  3  . progesterone (PROMETRIUM) 200 MG capsule Take 200 mg by mouth. 1-12 DAYS  During cycle       . S-Adenosylmethionine (  SAM-E) 400 MG TABS Take by mouth.         Current Facility-Administered Medications on File Prior to Visit  Medication Dose Route Frequency Provider Last Rate Last Dose  . DISCONTD: cyanocobalamin ((VITAMIN B-12)) injection 1,000 mcg  1,000 mcg Intramuscular Q30 days Stacie Glaze, MD   1,000 mcg at 05/19/11 1234  . DISCONTD: cyanocobalamin ((VITAMIN B-12)) injection 1,000 mcg  1,000 mcg Intramuscular Q30 days Stacie Glaze, MD   1,500 mcg at 08/22/11 1131    BP 130/70  Pulse 72  Temp 98.2 F (36.8 C)  Resp 16  Ht 5\' 4"  (1.626 m)  Wt 156 lb (70.761 kg)  BMI 26.78 kg/m2       Objective:   Physical Exam  Nursing note and vitals reviewed. Constitutional: She is oriented to person, place, and time. She appears well-developed and  well-nourished. No distress.  HENT:  Head: Normocephalic and atraumatic.  Right Ear: External ear normal.  Left Ear: External ear normal.  Nose: Nose normal.  Mouth/Throat: Oropharynx is clear and moist.  Eyes: Conjunctivae and EOM are normal. Pupils are equal, round, and reactive to light.  Neck: Normal range of motion. Neck supple. No JVD present. No tracheal deviation present. No thyromegaly present.  Cardiovascular: Normal rate, regular rhythm, normal heart sounds and intact distal pulses.   No murmur heard. Pulmonary/Chest: Effort normal and breath sounds normal. She has no wheezes. She exhibits no tenderness.  Abdominal: Soft. Bowel sounds are normal.  Musculoskeletal: Normal range of motion. She exhibits no edema and no tenderness.  Lymphadenopathy:    She has no cervical adenopathy.  Neurological: She is alert and oriented to person, place, and time. She has normal reflexes. No cranial nerve deficit.  Skin: Skin is warm and dry. She is not diaphoretic.  Psychiatric: She has a normal mood and affect. Her behavior is normal.          Assessment & Plan:  Stable Hypertension stable gastroesophageal stable and had allergic rhinitis stable.  Discussion of her recent weight loss and exercise program she has done quite well in reducing her risk factors for cardiovascular osteoarthritis and other complications by pursuing an aggressive program of exercise and weight loss  We reviewed dietary principles to assist her and continue her weight loss protocol

## 2011-09-25 ENCOUNTER — Ambulatory Visit (INDEPENDENT_AMBULATORY_CARE_PROVIDER_SITE_OTHER): Payer: Medicare Other | Admitting: Internal Medicine

## 2011-09-25 DIAGNOSIS — D519 Vitamin B12 deficiency anemia, unspecified: Secondary | ICD-10-CM

## 2011-09-25 DIAGNOSIS — D518 Other vitamin B12 deficiency anemias: Secondary | ICD-10-CM

## 2011-09-25 MED ORDER — CYANOCOBALAMIN 1000 MCG/ML IJ SOLN
1000.0000 ug | INTRAMUSCULAR | Status: AC
  Administered 2011-09-25: 1000 ug via INTRAMUSCULAR

## 2011-09-26 ENCOUNTER — Other Ambulatory Visit: Payer: Self-pay | Admitting: Obstetrics & Gynecology

## 2011-10-23 ENCOUNTER — Ambulatory Visit (INDEPENDENT_AMBULATORY_CARE_PROVIDER_SITE_OTHER): Payer: Medicare Other | Admitting: *Deleted

## 2011-10-23 DIAGNOSIS — E538 Deficiency of other specified B group vitamins: Secondary | ICD-10-CM

## 2011-10-23 MED ORDER — CYANOCOBALAMIN 1000 MCG/ML IJ SOLN
1000.0000 ug | Freq: Once | INTRAMUSCULAR | Status: AC
  Administered 2011-10-23: 1000 ug via INTRAMUSCULAR

## 2011-11-24 ENCOUNTER — Ambulatory Visit (INDEPENDENT_AMBULATORY_CARE_PROVIDER_SITE_OTHER): Payer: Medicare Other | Admitting: Internal Medicine

## 2011-11-24 DIAGNOSIS — D518 Other vitamin B12 deficiency anemias: Secondary | ICD-10-CM

## 2011-11-24 DIAGNOSIS — D519 Vitamin B12 deficiency anemia, unspecified: Secondary | ICD-10-CM

## 2011-11-24 MED ORDER — CYANOCOBALAMIN 1000 MCG/ML IJ SOLN
1000.0000 ug | INTRAMUSCULAR | Status: AC
  Administered 2011-11-24: 1000 ug via INTRAMUSCULAR

## 2011-11-27 ENCOUNTER — Encounter: Payer: Medicare Other | Admitting: Internal Medicine

## 2011-12-19 ENCOUNTER — Ambulatory Visit (INDEPENDENT_AMBULATORY_CARE_PROVIDER_SITE_OTHER): Payer: Medicare Other | Admitting: Internal Medicine

## 2011-12-19 DIAGNOSIS — D519 Vitamin B12 deficiency anemia, unspecified: Secondary | ICD-10-CM

## 2011-12-19 DIAGNOSIS — D518 Other vitamin B12 deficiency anemias: Secondary | ICD-10-CM

## 2011-12-19 MED ORDER — CYANOCOBALAMIN 1000 MCG/ML IJ SOLN
1000.0000 ug | INTRAMUSCULAR | Status: AC
  Administered 2011-12-19: 1000 ug via INTRAMUSCULAR

## 2012-01-11 ENCOUNTER — Other Ambulatory Visit: Payer: Self-pay | Admitting: Internal Medicine

## 2012-01-19 ENCOUNTER — Ambulatory Visit (INDEPENDENT_AMBULATORY_CARE_PROVIDER_SITE_OTHER): Payer: Medicare Other | Admitting: Internal Medicine

## 2012-01-19 DIAGNOSIS — D518 Other vitamin B12 deficiency anemias: Secondary | ICD-10-CM

## 2012-01-19 DIAGNOSIS — D519 Vitamin B12 deficiency anemia, unspecified: Secondary | ICD-10-CM

## 2012-01-19 MED ORDER — CYANOCOBALAMIN 1000 MCG/ML IJ SOLN
1000.0000 ug | INTRAMUSCULAR | Status: AC
  Administered 2012-01-19: 1000 ug via INTRAMUSCULAR

## 2012-02-02 ENCOUNTER — Ambulatory Visit (INDEPENDENT_AMBULATORY_CARE_PROVIDER_SITE_OTHER): Payer: Medicare Other | Admitting: Internal Medicine

## 2012-02-02 ENCOUNTER — Encounter: Payer: Self-pay | Admitting: Internal Medicine

## 2012-02-02 VITALS — BP 130/80 | HR 72 | Temp 98.0°F | Resp 16 | Ht 64.0 in | Wt 148.0 lb

## 2012-02-02 DIAGNOSIS — I10 Essential (primary) hypertension: Secondary | ICD-10-CM

## 2012-02-02 DIAGNOSIS — Z0000 Encounter for general adult medical examination without abnormal findings: Secondary | ICD-10-CM

## 2012-02-02 LAB — CBC WITH DIFFERENTIAL/PLATELET
Basophils Absolute: 0 10*3/uL (ref 0.0–0.1)
Basophils Relative: 0.3 % (ref 0.0–3.0)
Eosinophils Absolute: 0.1 10*3/uL (ref 0.0–0.7)
MCHC: 34.5 g/dL (ref 30.0–36.0)
MCV: 96.8 fl (ref 78.0–100.0)
Monocytes Absolute: 0.3 10*3/uL (ref 0.1–1.0)
Neutro Abs: 2.5 10*3/uL (ref 1.4–7.7)
Neutrophils Relative %: 55.7 % (ref 43.0–77.0)
RBC: 4.04 Mil/uL (ref 3.87–5.11)
RDW: 12.7 % (ref 11.5–14.6)

## 2012-02-02 LAB — LIPID PANEL
Cholesterol: 173 mg/dL (ref 0–200)
HDL: 59.7 mg/dL (ref >39.00)
LDL Cholesterol: 102 mg/dL — ABNORMAL HIGH (ref 0–99)
Total CHOL/HDL Ratio: 3
Triglycerides: 58 mg/dL (ref 0.0–149.0)

## 2012-02-02 LAB — POCT URINALYSIS DIPSTICK
Bilirubin, UA: NEGATIVE
Blood, UA: NEGATIVE
Glucose, UA: NEGATIVE
Ketones, UA: NEGATIVE
Spec Grav, UA: 1.02
Urobilinogen, UA: 0.2

## 2012-02-02 LAB — BASIC METABOLIC PANEL
CO2: 28 mEq/L (ref 19–32)
Calcium: 9.4 mg/dL (ref 8.4–10.5)
Creatinine, Ser: 0.8 mg/dL (ref 0.4–1.2)
GFR: 80.42 mL/min (ref >60.00)
Sodium: 139 mEq/L (ref 135–145)

## 2012-02-02 LAB — HEPATIC FUNCTION PANEL
Alkaline Phosphatase: 50 U/L (ref 39–117)
Bilirubin, Direct: 0.1 mg/dL (ref 0.0–0.3)
Total Bilirubin: 0.7 mg/dL (ref 0.3–1.2)
Total Protein: 7.4 g/dL (ref 6.0–8.3)

## 2012-02-02 NOTE — Patient Instructions (Signed)
The patient is instructed to continue all medications as prescribed. Schedule followup with check out clerk upon leaving the clinic  

## 2012-02-02 NOTE — Progress Notes (Signed)
Subjective:    Patient ID: Kelly Robles, female    DOB: 10-18-1945, 67 y.o.   MRN: [id number]  HPI CPX Hx of anemia.DJD, HTN and IBS   Review of Systems  Constitutional: Negative for activity change, appetite change and fatigue.  HENT: Negative for ear pain, congestion, neck pain, postnasal drip and sinus pressure.   Eyes: Negative for redness and visual disturbance.  Respiratory: Negative for cough, shortness of breath and wheezing.   Gastrointestinal: Negative for abdominal pain and abdominal distention.  Genitourinary: Negative for dysuria, frequency and menstrual problem.  Musculoskeletal: Negative for myalgias, joint swelling and arthralgias.  Skin: Negative for rash and wound.  Neurological: Negative for dizziness, weakness and headaches.  Hematological: Negative for adenopathy. Does not bruise/bleed easily.  Psychiatric/Behavioral: Negative for sleep disturbance and decreased concentration.   Past Medical History  Diagnosis Date  . GERD (gastroesophageal reflux disease)   . Allergy   . Hypertension   . Raynaud's disease   . DLE (discoid lupus erythematosus)   . Osteoporosis     History   Social History  . Marital Status: Married    Spouse Name: N/A    Number of Children: N/A  . Years of Education: N/A   Occupational History  . Not on file.   Social History Main Topics  . Smoking status: Former Smoker    Quit date: 01/13/1985  . Smokeless tobacco: Not on file  . Alcohol Use: Yes  . Drug Use: No  . Sexually Active: Not on file   Other Topics Concern  . Not on file   Social History Narrative  . No narrative on file    Past Surgical History  Procedure Date  . Cholecystectomy   . Tonsillectomy     Family History  Problem Relation Age of Onset  . Osteoporosis    . Arthritis Mother   . Cancer Father     Allergies  Allergen Reactions  . Bupropion Hcl     REACTION: Rash  . Sulfonamide Derivatives     REACTION: Rash    Current Outpatient  Prescriptions on File Prior to Visit  Medication Sig Dispense Refill  . famotidine (PEPCID AC) 10 MG chewable tablet Chew 10 mg by mouth daily.      . Artificial Saliva (BIOTENE ORALBALANCE DRY MOUTH) LIQD Use as directed in the mouth or throat daily.        Marland Kitchen aspirin 81 MG EC tablet Take 81 mg by mouth daily.        . Calcium Carbonate Antacid 650 MG tablet Take 650 mg by mouth 2 (two) times daily. 1400 total      . carvedilol (COREG) 6.25 MG tablet TAKE 0.5 TABLETS (3.125 MG TOTAL) BY MOUTH 2 (TWO) TIMES DAILY WITH A MEAL.  60 tablet  1  . cholecalciferol (VITAMIN D) 1000 UNITS tablet Take 3,000 Units by mouth daily.       . Coenzyme Q10 200 MG capsule Take 200 mg by mouth daily.      . hydroxychloroquine (PLAQUENIL) 200 MG tablet Take by mouth daily.        . hydrOXYzine (ATARAX/VISTARIL) 25 MG tablet Take 25 mg by mouth every 8 (eight) hours as needed.      . multivitamin (THERAGRAN) per tablet Take 1 tablet by mouth daily.        . S-Adenosylmethionine (SAM-E) 400 MG TABS Take by mouth.          BP 130/80  Pulse 72  Temp 98  F (36.7 C)  Resp 16  Ht 5\' 4"  (1.626 m)  Wt 148 lb (67.132 kg)  BMI 25.40 kg/m2       Objective:   Physical Exam  Nursing note and vitals reviewed. Constitutional: She is oriented to person, place, and time. She appears well-developed and well-nourished. No distress.  HENT:  Head: Normocephalic and atraumatic.  Right Ear: External ear normal.  Left Ear: External ear normal.  Nose: Nose normal.  Mouth/Throat: Oropharynx is clear and moist.  Eyes: Conjunctivae normal and EOM are normal. Pupils are equal, round, and reactive to light.  Neck: Normal range of motion. Neck supple. No JVD present. No tracheal deviation present. No thyromegaly present.  Cardiovascular: Normal rate, regular rhythm, normal heart sounds and intact distal pulses.   No murmur heard. Pulmonary/Chest: Effort normal and breath sounds normal. She has no wheezes. She exhibits no  tenderness.  Abdominal: Soft. Bowel sounds are normal.  Musculoskeletal: Normal range of motion. She exhibits no edema and no tenderness.  Lymphadenopathy:    She has no cervical adenopathy.  Neurological: She is alert and oriented to person, place, and time. She has normal reflexes. No cranial nerve deficit.  Skin: Skin is warm and dry. She is not diaphoretic.  Psychiatric: She has a normal mood and affect. Her behavior is normal.          Assessment & Plan:   This is a routine physical examination for this healthy  Female. Reviewed all health maintenance protocols including mammography colonoscopy bone density and reviewed appropriate screening labs. Her immunization history was reviewed as well as her current medications and allergies refills of her chronic medications were given and the plan for yearly health maintenance was discussed all orders and referrals were made as appropriate.  Stable HTN Monitor CBC for hx of anemia monitor bmet for HTN and renal functions IBS stable

## 2012-04-05 ENCOUNTER — Telehealth: Payer: Self-pay | Admitting: Internal Medicine

## 2012-04-05 MED ORDER — CYANOCOBALAMIN 1000 MCG/ML IJ SOLN: 1000.0000 ug | Freq: Once | INTRAMUSCULAR | Status: DC

## 2012-04-05 NOTE — Telephone Encounter (Signed)
Pt found b12 at [address]. Please call in rx and pt will pick up and sch appt for injection

## 2012-04-26 ENCOUNTER — Ambulatory Visit (INDEPENDENT_AMBULATORY_CARE_PROVIDER_SITE_OTHER): Payer: Medicare Other | Admitting: Internal Medicine

## 2012-04-26 DIAGNOSIS — D518 Other vitamin B12 deficiency anemias: Secondary | ICD-10-CM

## 2012-04-26 MED ORDER — CYANOCOBALAMIN 1000 MCG/ML IJ SOLN
1000.0000 ug | Freq: Once | INTRAMUSCULAR | Status: AC
  Administered 2012-04-26: 1000 ug via INTRAMUSCULAR

## 2012-05-07 ENCOUNTER — Other Ambulatory Visit: Payer: Self-pay | Admitting: Internal Medicine

## 2012-05-12 ENCOUNTER — Other Ambulatory Visit (HOSPITAL_COMMUNITY): Payer: Self-pay | Admitting: Interventional Radiology

## 2012-05-12 DIAGNOSIS — I8390 Asymptomatic varicose veins of unspecified lower extremity: Secondary | ICD-10-CM

## 2012-05-19 ENCOUNTER — Ambulatory Visit (INDEPENDENT_AMBULATORY_CARE_PROVIDER_SITE_OTHER): Payer: Medicare Other | Admitting: Internal Medicine

## 2012-05-19 DIAGNOSIS — E538 Deficiency of other specified B group vitamins: Secondary | ICD-10-CM

## 2012-05-19 MED ORDER — CYANOCOBALAMIN 1000 MCG/ML IJ SOLN
1000.0000 ug | INTRAMUSCULAR | Status: AC
  Administered 2012-05-19: 1000 ug via INTRAMUSCULAR

## 2012-05-26 ENCOUNTER — Ambulatory Visit
Admission: RE | Admit: 2012-05-26 | Discharge: 2012-05-26 | Disposition: A | Discharge status: Home / Self Care | Payer: Medicare Other | Source: Ambulatory Visit | Attending: Interventional Radiology | Admitting: Interventional Radiology

## 2012-05-26 DIAGNOSIS — I8390 Asymptomatic varicose veins of unspecified lower extremity: Secondary | ICD-10-CM

## 2012-07-02 ENCOUNTER — Ambulatory Visit (INDEPENDENT_AMBULATORY_CARE_PROVIDER_SITE_OTHER): Payer: Medicare Other | Admitting: Internal Medicine

## 2012-07-02 DIAGNOSIS — E538 Deficiency of other specified B group vitamins: Secondary | ICD-10-CM

## 2012-07-02 DIAGNOSIS — D518 Other vitamin B12 deficiency anemias: Secondary | ICD-10-CM

## 2012-07-02 DIAGNOSIS — D519 Vitamin B12 deficiency anemia, unspecified: Secondary | ICD-10-CM

## 2012-07-02 MED ORDER — CYANOCOBALAMIN 1000 MCG/ML IJ SOLN: 1000.0000 ug | Freq: Once | INTRAMUSCULAR | Status: DC

## 2012-07-02 MED ORDER — CYANOCOBALAMIN 1000 MCG/ML IJ SOLN
1000.0000 ug | INTRAMUSCULAR | Status: AC
  Administered 2012-07-02: 1000 ug via INTRAMUSCULAR

## 2012-08-02 ENCOUNTER — Ambulatory Visit: Payer: Medicare Other | Admitting: Internal Medicine

## 2012-08-03 ENCOUNTER — Other Ambulatory Visit (HOSPITAL_COMMUNITY): Payer: Self-pay | Admitting: Interventional Radiology

## 2012-08-03 DIAGNOSIS — I83812 Varicose veins of left lower extremities with pain: Secondary | ICD-10-CM

## 2012-08-05 ENCOUNTER — Encounter: Payer: Self-pay | Admitting: Internal Medicine

## 2012-08-05 ENCOUNTER — Ambulatory Visit (INDEPENDENT_AMBULATORY_CARE_PROVIDER_SITE_OTHER): Payer: Medicare Other | Admitting: Internal Medicine

## 2012-08-05 VITALS — BP 110/72 | HR 68 | Temp 98.2°F | Resp 16 | Ht 64.0 in | Wt 147.0 lb

## 2012-08-05 DIAGNOSIS — L930 Discoid lupus erythematosus: Secondary | ICD-10-CM

## 2012-08-05 DIAGNOSIS — D649 Anemia, unspecified: Secondary | ICD-10-CM

## 2012-08-05 DIAGNOSIS — I10 Essential (primary) hypertension: Secondary | ICD-10-CM

## 2012-08-05 DIAGNOSIS — K589 Irritable bowel syndrome without diarrhea: Secondary | ICD-10-CM

## 2012-08-05 DIAGNOSIS — D518 Other vitamin B12 deficiency anemias: Secondary | ICD-10-CM

## 2012-08-05 LAB — CBC WITH DIFFERENTIAL/PLATELET
Basophils Absolute: 0 10*3/uL (ref 0.0–0.1)
Eosinophils Absolute: 0.1 10*3/uL (ref 0.0–0.7)
HCT: 38.3 % (ref 36.0–46.0)
Lymphs Abs: 1.2 10*3/uL (ref 0.7–4.0)
MCHC: 34.5 g/dL (ref 30.0–36.0)
Monocytes Relative: 7.5 % (ref 3.0–12.0)
Platelets: 194 10*3/uL (ref 150.0–400.0)
RDW: 12.7 % (ref 11.5–14.6)

## 2012-08-05 MED ORDER — CYANOCOBALAMIN 1000 MCG/ML IJ SOLN
1000.0000 ug | Freq: Once | INTRAMUSCULAR | Status: AC
  Administered 2012-08-05: 1000 ug via INTRAMUSCULAR

## 2012-08-05 NOTE — Addendum Note (Signed)
Addended by: Alfred Levins D on: 08/05/2012 05:44 PM   Modules accepted: Orders

## 2012-08-05 NOTE — Progress Notes (Signed)
Subjective:    Patient ID: Kelly Robles, female    DOB: 11-25-1945, 67 y.o.   MRN: [id number]  HPI Blood pressure good Weight is good/ stable Follow [place] discoid lupus  Recent lower GI virus  Mild depression after the loss of her sister     Review of Systems  Constitutional: Negative for activity change, appetite change and fatigue.  HENT: Negative for ear pain, congestion, neck pain, postnasal drip and sinus pressure.   Eyes: Negative for redness and visual disturbance.  Respiratory: Negative for cough, shortness of breath and wheezing.   Gastrointestinal: Negative for abdominal pain and abdominal distention.  Genitourinary: Negative for dysuria, frequency and menstrual problem.  Musculoskeletal: Negative for myalgias, joint swelling and arthralgias.  Skin: Negative for rash and wound.  Neurological: Negative for dizziness, weakness and headaches.  Hematological: Negative for adenopathy. Does not bruise/bleed easily.  Psychiatric/Behavioral: Negative for sleep disturbance and decreased concentration.   Past Medical History  Diagnosis Date  . GERD (gastroesophageal reflux disease)   . Allergy   . Hypertension   . Raynaud's disease   . DLE (discoid lupus erythematosus)   . Osteoporosis     History   Social History  . Marital Status: Married    Spouse Name: N/A    Number of Children: N/A  . Years of Education: N/A   Occupational History  . Not on file.   Social History Main Topics  . Smoking status: Former Smoker    Quit date: 01/13/1985  . Smokeless tobacco: Not on file  . Alcohol Use: Yes  . Drug Use: No  . Sexually Active: Not on file   Other Topics Concern  . Not on file   Social History Narrative  . No narrative on file    Past Surgical History  Procedure Laterality Date  . Cholecystectomy    . Tonsillectomy      Family History  Problem Relation Age of Onset  . Osteoporosis    . Arthritis Mother   . Cancer Father     Allergies   Allergen Reactions  . Bupropion Hcl     REACTION: Rash  . Sulfonamide Derivatives     REACTION: Rash    Current Outpatient Prescriptions on File Prior to Visit  Medication Sig Dispense Refill  . Artificial Saliva (BIOTENE ORALBALANCE DRY MOUTH) LIQD Use as directed in the mouth or throat daily.        Marland Kitchen aspirin 81 MG EC tablet Take 81 mg by mouth daily.        . Calcium Carbonate Antacid 650 MG tablet Take 650 mg by mouth 2 (two) times daily. 1400 total      . carvedilol (COREG) 6.25 MG tablet TAKE 0.5 TABLETS (3.125 MG TOTAL) BY MOUTH 2 (TWO) TIMES DAILY WITH A MEAL.  60 tablet  1  . cholecalciferol (VITAMIN D) 1000 UNITS tablet Take 3,000 Units by mouth daily.       . Coenzyme Q10 200 MG capsule Take 200 mg by mouth daily.      . cyanocobalamin (,VITAMIN B-12,) 1000 MCG/ML injection Inject 1 mL (1,000 mcg total) into the muscle once. Please notify pt when ready for pick up  10 mL  1  . famotidine (PEPCID AC) 10 MG chewable tablet Chew 10 mg by mouth daily.      . fish oil-omega-3 fatty acids 1000 MG capsule Take 2 g by mouth daily.      . hydroxychloroquine (PLAQUENIL) 200 MG tablet Take by mouth daily.        Marland Kitchen  hydrOXYzine (ATARAX/VISTARIL) 25 MG tablet Take 25 mg by mouth every 8 (eight) hours as needed.      . multivitamin (THERAGRAN) per tablet Take 1 tablet by mouth daily.        . S-Adenosylmethionine (SAM-E) 400 MG TABS Take by mouth.         No current facility-administered medications on file prior to visit.    BP 110/72  Pulse 68  Temp(Src) 98.2 F (36.8 C)  Resp 16  Ht 5\' 4"  (1.626 m)  Wt 147 lb (66.679 kg)  BMI 25.22 kg/m2        Objective:   Physical Exam  Nursing note reviewed. Constitutional: She is oriented to person, place, and time. She appears well-developed and well-nourished. No distress.  HENT:  Head: Normocephalic and atraumatic.  Right Ear: External ear normal.  Left Ear: External ear normal.  Nose: Nose normal.  Mouth/Throat: Oropharynx is  clear and moist.  Eyes: Conjunctivae and EOM are normal. Pupils are equal, round, and reactive to light.  Neck: Normal range of motion. Neck supple. No JVD present. No tracheal deviation present. No thyromegaly present.  Cardiovascular: Normal rate, regular rhythm, normal heart sounds and intact distal pulses.   No murmur heard. Pulmonary/Chest: Effort normal and breath sounds normal. She has no wheezes. She exhibits no tenderness.  Abdominal: Soft. Bowel sounds are normal.  Musculoskeletal: Normal range of motion. She exhibits no edema and no tenderness.  Lymphadenopathy:    She has no cervical adenopathy.  Neurological: She is alert and oriented to person, place, and time. She has normal reflexes. No cranial nerve deficit.  Skin: Skin is warm and dry. She is not diaphoretic.  Psychiatric: She has a normal mood and affect. Her behavior is normal.          Assessment & Plan:  HTN Stable blood pressure GERD/IBS stable  B12 anemia/ neuropathy Monitor levels and get injections today  Moderate depression Grief reaction Discussion of  counselling Group visit... hospice

## 2012-08-05 NOTE — Patient Instructions (Addendum)
Consider calling hospice for grief class  mammogram in August. Ask Dr. Jennette Kettle to set up your mammography in your bone density account because of your insurance status

## 2012-08-09 LAB — METHYLMALONIC ACID, SERUM: Methylmalonic Acid, Quant: 0.12 umol/L (ref <0.40)

## 2012-08-11 ENCOUNTER — Telehealth: Payer: Self-pay | Admitting: Emergency Medicine

## 2012-08-11 NOTE — Telephone Encounter (Signed)
PT RETURNED OUR CALL RE:60MO F/U CONSERVATIVE [place] APPT.   PT STATED THAT SHE HAS NOT BEEN WEARING HER COMPRESSION GARMENTS YET DUE TO HER REFLUX.  SHE WILL CONTACT us WHEN SHE BEGINS TO WEAR THEM.

## 2012-08-24 ENCOUNTER — Other Ambulatory Visit: Payer: Self-pay | Admitting: Internal Medicine

## 2012-08-27 ENCOUNTER — Ambulatory Visit (INDEPENDENT_AMBULATORY_CARE_PROVIDER_SITE_OTHER): Payer: Medicare Other | Admitting: Internal Medicine

## 2012-08-27 DIAGNOSIS — D519 Vitamin B12 deficiency anemia, unspecified: Secondary | ICD-10-CM

## 2012-08-27 DIAGNOSIS — D518 Other vitamin B12 deficiency anemias: Secondary | ICD-10-CM

## 2012-08-27 MED ORDER — CYANOCOBALAMIN 1000 MCG/ML IJ SOLN
1000.0000 ug | INTRAMUSCULAR | Status: AC
  Administered 2012-08-27: 1000 ug via INTRAMUSCULAR

## 2012-09-02 ENCOUNTER — Telehealth: Payer: Self-pay | Admitting: Radiology

## 2012-09-02 NOTE — Telephone Encounter (Signed)
Patient states that she will call back when ready to schedule appointment to followup varicose veins.  Dorinne Graeff Carmell Austria, RN 09/02/2012 3:41 PM

## 2012-09-23 ENCOUNTER — Ambulatory Visit (INDEPENDENT_AMBULATORY_CARE_PROVIDER_SITE_OTHER): Payer: Medicare Other | Admitting: *Deleted

## 2012-09-23 DIAGNOSIS — D518 Other vitamin B12 deficiency anemias: Secondary | ICD-10-CM

## 2012-09-23 DIAGNOSIS — D519 Vitamin B12 deficiency anemia, unspecified: Secondary | ICD-10-CM

## 2012-09-23 MED ORDER — CYANOCOBALAMIN 1000 MCG/ML IJ SOLN
1000.0000 ug | INTRAMUSCULAR | Status: AC
  Administered 2012-09-23: 1000 ug via INTRAMUSCULAR

## 2012-10-13 ENCOUNTER — Telehealth: Payer: Self-pay | Admitting: Internal Medicine

## 2012-10-13 NOTE — Telephone Encounter (Signed)
Done kh

## 2012-10-13 NOTE — Telephone Encounter (Signed)
It will be ok

## 2012-10-13 NOTE — Telephone Encounter (Signed)
Pt would like to know if she can come in a week early for her b12 inj? Pt is starting new job in [place] and it will be difficult to get here after that. If she can.

## 2012-10-15 ENCOUNTER — Ambulatory Visit (INDEPENDENT_AMBULATORY_CARE_PROVIDER_SITE_OTHER): Payer: Medicare Other | Admitting: Internal Medicine

## 2012-10-15 DIAGNOSIS — D519 Vitamin B12 deficiency anemia, unspecified: Secondary | ICD-10-CM

## 2012-10-15 DIAGNOSIS — D518 Other vitamin B12 deficiency anemias: Secondary | ICD-10-CM

## 2012-10-15 MED ORDER — CYANOCOBALAMIN 1000 MCG/ML IJ SOLN
1000.0000 ug | INTRAMUSCULAR | Status: AC
  Administered 2012-10-15: 1000 ug via INTRAMUSCULAR

## 2012-11-18 ENCOUNTER — Other Ambulatory Visit: Payer: Self-pay

## 2012-11-19 ENCOUNTER — Ambulatory Visit (INDEPENDENT_AMBULATORY_CARE_PROVIDER_SITE_OTHER): Payer: Medicare Other | Admitting: Internal Medicine

## 2012-11-19 DIAGNOSIS — D518 Other vitamin B12 deficiency anemias: Secondary | ICD-10-CM

## 2012-11-19 DIAGNOSIS — D519 Vitamin B12 deficiency anemia, unspecified: Secondary | ICD-10-CM

## 2012-11-19 MED ORDER — CYANOCOBALAMIN 1000 MCG/ML IJ SOLN
1000.0000 ug | INTRAMUSCULAR | Status: AC
  Administered 2012-11-19: 1000 ug via INTRAMUSCULAR

## 2012-12-22 ENCOUNTER — Ambulatory Visit: Payer: Medicare Other | Admitting: Internal Medicine

## 2012-12-23 ENCOUNTER — Ambulatory Visit (INDEPENDENT_AMBULATORY_CARE_PROVIDER_SITE_OTHER): Payer: Medicare Other | Admitting: Internal Medicine

## 2012-12-23 DIAGNOSIS — E538 Deficiency of other specified B group vitamins: Secondary | ICD-10-CM

## 2012-12-23 MED ORDER — CYANOCOBALAMIN 1000 MCG/ML IJ SOLN
1000.0000 ug | Freq: Once | INTRAMUSCULAR | Status: AC
  Administered 2012-12-23: 1000 ug via INTRAMUSCULAR

## 2012-12-27 ENCOUNTER — Other Ambulatory Visit: Payer: Self-pay | Admitting: Internal Medicine

## 2013-01-21 ENCOUNTER — Ambulatory Visit (INDEPENDENT_AMBULATORY_CARE_PROVIDER_SITE_OTHER): Payer: Medicare Other | Admitting: *Deleted

## 2013-01-21 DIAGNOSIS — D519 Vitamin B12 deficiency anemia, unspecified: Secondary | ICD-10-CM

## 2013-01-21 DIAGNOSIS — D518 Other vitamin B12 deficiency anemias: Secondary | ICD-10-CM

## 2013-01-21 MED ORDER — CYANOCOBALAMIN 1000 MCG/ML IJ SOLN
1000.0000 ug | INTRAMUSCULAR | Status: DC
  Administered 2013-01-21: 1000 ug via INTRAMUSCULAR

## 2013-02-16 ENCOUNTER — Encounter: Payer: Self-pay | Admitting: Internal Medicine

## 2013-02-16 ENCOUNTER — Ambulatory Visit (INDEPENDENT_AMBULATORY_CARE_PROVIDER_SITE_OTHER): Payer: Medicare Other | Admitting: Internal Medicine

## 2013-02-16 VITALS — BP 126/72 | HR 72 | Temp 98.2°F | Resp 16 | Ht 64.0 in | Wt 147.0 lb

## 2013-02-16 DIAGNOSIS — Z0000 Encounter for general adult medical examination without abnormal findings: Secondary | ICD-10-CM

## 2013-02-16 DIAGNOSIS — D519 Vitamin B12 deficiency anemia, unspecified: Secondary | ICD-10-CM

## 2013-02-16 DIAGNOSIS — I10 Essential (primary) hypertension: Secondary | ICD-10-CM

## 2013-02-16 DIAGNOSIS — L930 Discoid lupus erythematosus: Secondary | ICD-10-CM

## 2013-02-16 DIAGNOSIS — D518 Other vitamin B12 deficiency anemias: Secondary | ICD-10-CM

## 2013-02-16 LAB — VITAMIN B12: Vitamin B-12: 636 pg/mL (ref 211–911)

## 2013-02-16 LAB — BASIC METABOLIC PANEL
BUN: 8 mg/dL (ref 6–23)
CO2: 26 mEq/L (ref 19–32)
Calcium: 9.3 mg/dL (ref 8.4–10.5)
Chloride: 104 mEq/L (ref 96–112)
Creatinine, Ser: 0.7 mg/dL (ref 0.4–1.2)
GFR: 91.15 mL/min (ref >60.00)
Glucose, Bld: 82 mg/dL (ref 70–99)
Potassium: 4 mEq/L (ref 3.5–5.1)
Sodium: 139 mEq/L (ref 135–145)

## 2013-02-16 LAB — LIPID PANEL
CHOL/HDL RATIO: 3
CHOLESTEROL: 161 mg/dL (ref 0–200)
HDL: 62 mg/dL (ref >39.00)
LDL CALC: 89 mg/dL (ref 0–99)
TRIGLYCERIDES: 50 mg/dL (ref 0.0–149.0)
VLDL: 10 mg/dL (ref 0.0–40.0)

## 2013-02-16 LAB — CBC WITH DIFFERENTIAL/PLATELET
BASOS ABS: 0 10*3/uL (ref 0.0–0.1)
Basophils Relative: 0.4 % (ref 0.0–3.0)
Eosinophils Absolute: 0.2 10*3/uL (ref 0.0–0.7)
Eosinophils Relative: 3.7 % (ref 0.0–5.0)
HCT: 39.9 % (ref 36.0–46.0)
Hemoglobin: 13.5 g/dL (ref 12.0–15.0)
LYMPHS PCT: 34.5 % (ref 12.0–46.0)
Lymphs Abs: 1.7 10*3/uL (ref 0.7–4.0)
MCHC: 33.9 g/dL (ref 30.0–36.0)
MCV: 97 fl (ref 78.0–100.0)
Monocytes Absolute: 0.3 10*3/uL (ref 0.1–1.0)
Monocytes Relative: 6.4 % (ref 3.0–12.0)
NEUTROS PCT: 55 % (ref 43.0–77.0)
Neutro Abs: 2.7 10*3/uL (ref 1.4–7.7)
Platelets: 196 10*3/uL (ref 150.0–400.0)
RBC: 4.12 Mil/uL (ref 3.87–5.11)
RDW: 12.7 % (ref 11.5–14.6)
WBC: 4.9 10*3/uL (ref 4.5–10.5)

## 2013-02-16 LAB — HEPATIC FUNCTION PANEL
ALT: 14 U/L (ref 0–35)
AST: 22 U/L (ref 0–37)
Albumin: 3.5 g/dL (ref 3.5–5.2)
Alkaline Phosphatase: 64 U/L (ref 39–117)
BILIRUBIN DIRECT: 0.1 mg/dL (ref 0.0–0.3)
BILIRUBIN TOTAL: 0.7 mg/dL (ref 0.3–1.2)
Total Protein: 7.5 g/dL (ref 6.0–8.3)

## 2013-02-16 LAB — TSH: TSH: 1.28 u[IU]/mL (ref 0.35–5.50)

## 2013-02-16 MED ORDER — CYANOCOBALAMIN 1000 MCG/ML IJ SOLN
1000.0000 ug | INTRAMUSCULAR | Status: AC
  Administered 2013-02-16: 1000 ug via INTRAMUSCULAR

## 2013-02-16 MED ORDER — CARVEDILOL 3.125 MG PO TABS: ORAL_TABLET | ORAL | Status: DC

## 2013-02-16 NOTE — Addendum Note (Signed)
Addended by: Allyne Gee on: 02/16/2013 12:26 PM   Modules accepted: Orders

## 2013-02-16 NOTE — Patient Instructions (Signed)
The patient is instructed to continue all medications as prescribed. Schedule followup with check out clerk upon leaving the clinic  

## 2013-02-16 NOTE — Progress Notes (Signed)
Pre visit review using our clinic review tool, if applicable. No additional management support is needed unless otherwise documented below in the visit note. 

## 2013-02-16 NOTE — Progress Notes (Signed)
Subjective:    Patient ID: Kelly Robles, female    DOB: March 27, 1945, 68 y.o.   MRN: [id number]  HPI Welness exam and medicare annual exam Patient is a 68 year old female who is followed for multiple medical problems including hypertension mild GERD hyperlipidemia currently on omega-3 fatty acids And she has lupus erythematosus discoid type.  Additional complaints of afternoon fatigue and has cut back on the coreg and palpitations have been stable  She is on Plaquenil and multiple medications that mandate side effect monitoring.  Appropriate additional screening blood work today should include a complete metabolic panel lipid panel a CBC and a thyroid panel.    Review of Systems  Constitutional: Negative.   HENT: Positive for postnasal drip.   Eyes: Negative.   Respiratory: Negative.   Gastrointestinal: Positive for abdominal pain.       Off nexxium  Genitourinary: Positive for frequency. Negative for dysuria.  Musculoskeletal: Negative.   Skin: Negative.   Neurological: Negative for tremors, seizures, speech difficulty and light-headedness.  Hematological: Negative.   Psychiatric/Behavioral: Negative.        Objective:   Physical Exam  Nursing note and vitals reviewed. Constitutional: She is oriented to person, place, and time. She appears well-developed and well-nourished.  HENT:  Head: Normocephalic and atraumatic.  Eyes: Conjunctivae are normal. Pupils are equal, round, and reactive to light.  Neck: Neck supple. No JVD present. No tracheal deviation present. No thyromegaly present.  Cardiovascular: Normal rate and regular rhythm.   Murmur heard. Pulmonary/Chest: Effort normal and breath sounds normal.  Abdominal: Bowel sounds are normal.  Musculoskeletal: Normal range of motion.  Neurological: She is alert and oriented to person, place, and time.  Skin: Skin is warm and dry.  Psychiatric: She has a normal mood and affect. Her behavior is normal.            Assessment & Plan:  Stable HTN, SLE discoid Screening and monioring labs prdered Wellness exam and medicare screening questions asked  Titrate off the coreg? Subjective:    Kelly Robles is a 68 y.o. female who presents for Medicare Annual/Subsequent preventive examination.  Preventive Screening-Counseling & Management  Tobacco History  Smoking status  . Former Smoker  . Quit date: 01/13/1985  Smokeless tobacco  . Not on file     Problems Prior to Visit 1.   Current Problems (verified) Patient Active Problem List   Diagnosis Date Noted  . Anemia 04/29/2010  . WEIGHT GAIN 04/30/2009  . PALPITATIONS, OCCASIONAL 04/30/2009  . NECK MASS 04/28/2008  . PAIN IN JOINT, UPPER ARM 01/26/2007  . VARICOSE VEINS, LOWER EXTREMITIES 09/29/2006  . ANEMIA, B12 DEFICIENCY 08/26/2006  . 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 LUPUS ERYTHEMATOSUS, DISCOID 08/26/2006  . OSTEOPOROSIS 08/26/2006  . HYPERTENSION 07/07/2006  . ALLERGIC RHINITIS 07/07/2006  . GERD 07/07/2006  . IRRITABLE BOWEL SYNDROME 07/07/2006  . DEGENERATIVE JOINT DISEASE 07/07/2006  . COLONIC POLYPS, ADENOMATOUS 10/26/2003    Medications Prior to Visit Current Outpatient Prescriptions on File Prior to Visit  Medication Sig Dispense Refill  . Artificial Saliva (BIOTENE ORALBALANCE DRY MOUTH) LIQD Use as directed in the mouth or throat daily.        Marland Kitchen aspirin 81 MG EC tablet Take 81 mg by mouth daily.        . Calcium Carbonate Antacid 650 MG tablet Take 650 mg by mouth 2 (two) times daily. 1400 total      . cholecalciferol (VITAMIN D) 1000 UNITS tablet Take 3,000  Units by mouth daily.       . Coenzyme Q10 200 MG capsule Take 200 mg by mouth daily.      . cyanocobalamin (,VITAMIN B-12,) 1000 MCG/ML injection Inject 1 mL (1,000 mcg total) into the muscle once. Please notify pt when ready for pick up  10 mL  1  . famotidine (PEPCID AC) 10 MG chewable tablet Chew 10 mg by mouth daily.      . fish  oil-omega-3 fatty acids 1000 MG capsule Take 2 g by mouth daily. 1920 mg total taken daily      . hydroxychloroquine (PLAQUENIL) 200 MG tablet Take by mouth daily.        . hydrOXYzine (ATARAX/VISTARIL) 25 MG tablet Take 25 mg by mouth every 8 (eight) hours as needed.      . multivitamin (THERAGRAN) per tablet Take 1 tablet by mouth daily.        . S-Adenosylmethionine (SAM-E) 400 MG TABS Take by mouth.         No current facility-administered medications on file prior to visit.    Current Medications (verified) Current Outpatient Prescriptions  Medication Sig Dispense Refill  . Artificial Saliva (BIOTENE ORALBALANCE DRY MOUTH) LIQD Use as directed in the mouth or throat daily.        Marland Kitchen aspirin 81 MG EC tablet Take 81 mg by mouth daily.        . Calcium Carbonate Antacid 650 MG tablet Take 650 mg by mouth 2 (two) times daily. 1400 total      . carvedilol (COREG) 3.125 MG tablet 1/2 tablet daily  30 tablet  6  . cholecalciferol (VITAMIN D) 1000 UNITS tablet Take 3,000 Units by mouth daily.       . Coenzyme Q10 200 MG capsule Take 200 mg by mouth daily.      . cyanocobalamin (,VITAMIN B-12,) 1000 MCG/ML injection Inject 1 mL (1,000 mcg total) into the muscle once. Please notify pt when ready for pick up  10 mL  1  . famotidine (PEPCID AC) 10 MG chewable tablet Chew 10 mg by mouth daily.      . fish oil-omega-3 fatty acids 1000 MG capsule Take 2 g by mouth daily. 1920 mg total taken daily      . hydroxychloroquine (PLAQUENIL) 200 MG tablet Take by mouth daily.        . hydrOXYzine (ATARAX/VISTARIL) 25 MG tablet Take 25 mg by mouth every 8 (eight) hours as needed.      . multivitamin (THERAGRAN) per tablet Take 1 tablet by mouth daily.        . S-Adenosylmethionine (SAM-E) 400 MG TABS Take by mouth.         No current facility-administered medications for this visit.     Allergies (verified) Bupropion hcl and Sulfonamide derivatives   PAST HISTORY  Family History Family History    Problem Relation Age of Onset  . Osteoporosis    . Arthritis Mother   . Cancer Father     Social History History  Substance Use Topics  . Smoking status: Former Smoker    Quit date: 01/13/1985  . Smokeless tobacco: Not on file  . Alcohol Use: Yes     Are there smokers in your home (other than you)? No  Risk Factors Current exercise habits: Home exercise routine includes treadmill.  Dietary issues discussed: discussed her diet plan gluten free   Cardiac risk factors: advanced age (older than 70 for men, 32 for women), dyslipidemia  and sedentary lifestyle.  Depression Screen (Note: if answer to either of the following is "Yes", a more complete depression screening is indicated)   Over the past two weeks, have you felt down, depressed or hopeless? No  Over the past two weeks, have you felt little interest or pleasure in doing things? No  Have you lost interest or pleasure in daily life? No  Do you often feel hopeless? No  Do you cry easily over simple problems? No  Activities of Daily Living In your present state of health, do you have any difficulty performing the following activities?:  Driving? No Managing money?  No Feeding yourself? No Getting from bed to chair? No Climbing a flight of stairs? No Preparing food and eating?: No Bathing or showering? No Getting dressed: No Getting to the toilet? No Using the toilet:No Moving around from place to place: No In the past year have you fallen or had a near fall?:No   Are you sexually active?  Yes  Do you have more than one partner?  No  Hearing Difficulties: No Do you often ask people to speak up or repeat themselves? No Do you experience ringing or noises in your ears? No Do you have difficulty understanding soft or whispered voices? No   Do you feel that you have a problem with memory? No  Do you often misplace items? No  Do you feel safe at home?  Yes  Cognitive Testing  Alert? Yes  Normal  Appearance?Yes  Oriented to person? Yes  Place? Yes   Time? Yes  Recall of three objects?  Yes  Can perform simple calculations? Yes  Displays appropriate judgment?Yes  Can read the correct time from a watch face?Yes   Advanced Directives have been discussed with the patient? Yes  List the Names of Other Physician/Practitioners you currently use: 1.    Indicate any recent Medical Services you may have received from other than [facility] providers in the past year (date may be approximate).  Immunization History  Administered Date(s) Administered  . Influenza Whole 01/13/2005, 11/30/2006, 10/04/2007, 10/26/2008, 11/09/2009  . Pneumococcal Polysaccharide-23 01/29/2009  . Td 01/13/2005    Screening Tests Health Maintenance  Topic Date Due  . Zostavax  01/02/2004  . Mammogram  06/10/2010  . Influenza Vaccine  08/13/2012  . Tetanus/tdap  01/14/2015  . Colonoscopy  06/01/2019  . Pneumococcal Polysaccharide Vaccine Age 41 And Over  Completed    All answers were reviewed with the patient and necessary referrals were made:  Georgetta Haber, MD   02/16/2013   History reviewed: allergies, current medications, past family history, past medical history, past social history, past surgical history and problem list  Review of Systems Pertinent items are noted in HPI.    Objective:     Vision by Snellen chart: right eye:20/20, left eye:20/20  glasses  Body mass index is 25.22 kg/(m^2). BP 126/72  Pulse 72  Temp(Src) 98.2 F (36.8 C)  Resp 16  Ht 5\' 4"  (1.626 m)  Wt 147 lb (66.679 kg)  BMI 25.22 kg/m2  Exam in CPX      Assessment:      This is a routine physical examination for this healthy  Female. Reviewed all health maintenance protocols including mammography colonoscopy bone density and reviewed appropriate screening labs. Her immunization history was reviewed as well as her current medications and allergies refills of her chronic medications were given and the plan for  yearly health maintenance was discussed all orders and referrals were  made as appropriate.      Plan:     During the course of the visit the patient was educated and counseled about appropriate screening and preventive services including:    Td vaccine  Screening mammography  Diet review for nutrition referral? Yes ____  Not Indicated ____   Patient Instructions (the written plan) was given to the patient.  Medicare Attestation I have personally reviewed: The patient's medical and social history Their use of alcohol, tobacco or illicit drugs Their current medications and supplements The patient's functional ability including ADLs,fall risks, home safety risks, cognitive, and hearing and visual impairment Diet and physical activities Evidence for depression or mood disorders  The patient's weight, height, BMI, and visual acuity have been recorded in the chart.  I have made referrals, counseling, and provided education to the patient based on review of the above and I have provided the patient with a written personalized care plan for preventive services.     Georgetta Haber, MD   02/16/2013

## 2013-02-17 LAB — VITAMIN D 25 HYDROXY (VIT D DEFICIENCY, FRACTURES): Vit D, 25-Hydroxy: 67 ng/mL (ref 30–89)

## 2013-03-21 ENCOUNTER — Ambulatory Visit (INDEPENDENT_AMBULATORY_CARE_PROVIDER_SITE_OTHER): Payer: Medicare Other | Admitting: Internal Medicine

## 2013-03-21 DIAGNOSIS — E538 Deficiency of other specified B group vitamins: Secondary | ICD-10-CM

## 2013-03-21 MED ORDER — CARVEDILOL 3.125 MG PO TABS: 3.1250 mg | ORAL_TABLET | Freq: Two times a day (BID) | ORAL | Status: DC

## 2013-03-21 MED ORDER — CYANOCOBALAMIN 1000 MCG/ML IJ SOLN
1000.0000 ug | Freq: Once | INTRAMUSCULAR | Status: AC
  Administered 2013-03-21: 1000 ug via INTRAMUSCULAR

## 2013-04-21 ENCOUNTER — Ambulatory Visit (INDEPENDENT_AMBULATORY_CARE_PROVIDER_SITE_OTHER): Payer: Medicare Other | Admitting: *Deleted

## 2013-04-21 DIAGNOSIS — E538 Deficiency of other specified B group vitamins: Secondary | ICD-10-CM

## 2013-04-21 MED ORDER — CYANOCOBALAMIN 1000 MCG/ML IJ SOLN
1000.0000 ug | Freq: Once | INTRAMUSCULAR | Status: AC
  Administered 2013-04-21: 1000 ug via INTRAMUSCULAR

## 2013-05-17 ENCOUNTER — Ambulatory Visit (INDEPENDENT_AMBULATORY_CARE_PROVIDER_SITE_OTHER): Payer: Medicare Other | Admitting: *Deleted

## 2013-05-17 DIAGNOSIS — E538 Deficiency of other specified B group vitamins: Secondary | ICD-10-CM

## 2013-05-17 MED ORDER — CYANOCOBALAMIN 1000 MCG/ML IJ SOLN
1000.0000 ug | Freq: Once | INTRAMUSCULAR | Status: AC
  Administered 2013-05-17: 1000 ug via INTRAMUSCULAR

## 2013-06-13 ENCOUNTER — Ambulatory Visit (INDEPENDENT_AMBULATORY_CARE_PROVIDER_SITE_OTHER): Payer: Medicare Other

## 2013-06-13 DIAGNOSIS — D518 Other vitamin B12 deficiency anemias: Secondary | ICD-10-CM

## 2013-06-13 MED ORDER — CYANOCOBALAMIN 1000 MCG/ML IJ SOLN
1000.0000 ug | Freq: Once | INTRAMUSCULAR | Status: AC
  Administered 2013-06-13: 1000 ug via INTRAMUSCULAR

## 2013-07-22 ENCOUNTER — Ambulatory Visit (INDEPENDENT_AMBULATORY_CARE_PROVIDER_SITE_OTHER): Payer: Medicare Other | Admitting: Internal Medicine

## 2013-07-22 ENCOUNTER — Encounter: Payer: Self-pay | Admitting: Internal Medicine

## 2013-07-22 VITALS — BP 112/70 | HR 68 | Temp 98.1°F | Ht 64.0 in | Wt 150.0 lb

## 2013-07-22 DIAGNOSIS — Z20828 Contact with and (suspected) exposure to other viral communicable diseases: Secondary | ICD-10-CM

## 2013-07-22 DIAGNOSIS — I10 Essential (primary) hypertension: Secondary | ICD-10-CM

## 2013-07-22 DIAGNOSIS — E538 Deficiency of other specified B group vitamins: Secondary | ICD-10-CM

## 2013-07-22 DIAGNOSIS — L930 Discoid lupus erythematosus: Secondary | ICD-10-CM

## 2013-07-22 MED ORDER — CYANOCOBALAMIN 1000 MCG/ML IJ SOLN
1000.0000 ug | Freq: Once | INTRAMUSCULAR | Status: AC
  Administered 2013-07-22: 1000 ug via INTRAMUSCULAR

## 2013-07-22 NOTE — Addendum Note (Signed)
Addended by: Townsend Roger D on: 07/22/2013 05:04 PM   Modules accepted: Orders

## 2013-07-22 NOTE — Progress Notes (Signed)
Pre visit review using our clinic review tool, if applicable. No additional management support is needed unless otherwise documented below in the visit note. 

## 2013-07-22 NOTE — Progress Notes (Signed)
Subjective:    Patient ID: Kelly Robles, female    DOB: December 18, 1945, 68 y.o.   MRN: [id number]  HPI Stable on a vegan diet Blood pressure stable   Review of Systems  HENT: Negative.   Eyes: Negative.   Respiratory: Negative.   Cardiovascular: Negative.   Gastrointestinal: Negative.   Genitourinary: Negative.   Musculoskeletal: Negative.   Skin: Negative.        Past Medical History  Diagnosis Date  . GERD (gastroesophageal reflux disease)   . Allergy   . Hypertension   . Raynaud's disease   . DLE (discoid lupus erythematosus)   . Osteoporosis     History   Social History  . Marital Status: Married    Spouse Name: N/A    Number of Children: N/A  . Years of Education: N/A   Occupational History  . Not on file.   Social History Main Topics  . Smoking status: Former Smoker    Quit date: 01/13/1985  . Smokeless tobacco: Not on file  . Alcohol Use: Yes  . Drug Use: No  . Sexual Activity: Not on file   Other Topics Concern  . Not on file   Social History Narrative  . No narrative on file    Past Surgical History  Procedure Laterality Date  . Cholecystectomy    . Tonsillectomy      Family History  Problem Relation Age of Onset  . Osteoporosis    . Arthritis Mother   . Cancer Father     Allergies  Allergen Reactions  . Bupropion Hcl     REACTION: Rash  . Sulfonamide Derivatives     REACTION: Rash    Current Outpatient Prescriptions on File Prior to Visit  Medication Sig Dispense Refill  . Artificial Saliva (BIOTENE ORALBALANCE DRY MOUTH) LIQD Use as directed in the mouth or throat daily.        Marland Kitchen aspirin 81 MG EC tablet Take 81 mg by mouth daily.        . Calcium Carbonate Antacid 650 MG tablet Take 650 mg by mouth 2 (two) times daily. 1400 total      . carvedilol (COREG) 3.125 MG tablet Take 1 tablet (3.125 mg total) by mouth 2 (two) times daily with a meal.  60 tablet  5  . cholecalciferol (VITAMIN D) 1000 UNITS tablet Take 3,000 Units  by mouth daily.       . Coenzyme Q10 200 MG capsule Take 200 mg by mouth daily.      . cyanocobalamin (,VITAMIN B-12,) 1000 MCG/ML injection Inject 1 mL (1,000 mcg total) into the muscle once. Please notify pt when ready for pick up  10 mL  1  . famotidine (PEPCID AC) 10 MG chewable tablet Chew 10 mg by mouth daily.      . fish oil-omega-3 fatty acids 1000 MG capsule Take 2 g by mouth daily. 1920 mg total taken daily      . hydroxychloroquine (PLAQUENIL) 200 MG tablet Take by mouth daily.        . hydrOXYzine (ATARAX/VISTARIL) 25 MG tablet Take 25 mg by mouth every 8 (eight) hours as needed.      . multivitamin (THERAGRAN) per tablet Take 1 tablet by mouth daily.         No current facility-administered medications on file prior to visit.    BP 112/70  Pulse 68  Temp(Src) 98.1 F (36.7 C) (Oral)  Ht 5\' 4"  (1.626 m)  Wt 150  lb (68.04 kg)  BMI 25.73 kg/m2    Objective:   Physical Exam  Constitutional: She is oriented to person, place, and time. She appears well-developed and well-nourished. No distress.  HENT:  Head: Normocephalic and atraumatic.  Eyes: Conjunctivae and EOM are normal. Pupils are equal, round, and reactive to light.  Neck: Normal range of motion. Neck supple. No JVD present. No tracheal deviation present. No thyromegaly present.  Cardiovascular: Normal rate, regular rhythm and intact distal pulses.   Murmur heard. Pulmonary/Chest: Effort normal and breath sounds normal. She has no wheezes. She exhibits no tenderness.  Abdominal: Soft. Bowel sounds are normal.  Musculoskeletal: Normal range of motion. She exhibits no edema and no tenderness.  Lymphadenopathy:    She has no cervical adenopathy.  Neurological: She is alert and oriented to person, place, and time. She has normal reflexes. No cranial nerve deficit.  Skin: Skin is warm and dry. No rash noted. She is not diaphoretic. No erythema.  Tapering plaquinil  Psychiatric: She has a normal mood and affect. Her  behavior is normal.          Assessment & Plan:  Discoid or skin lupus is in remission May trial of the plaquil due to improved skin and dry mouth on vegan diet  Improved HTN table the coreg to 1/2 and monitor  CPX with Dr Yong Channel in    [place] for hep immunity due to working in [employer]

## 2013-07-22 NOTE — Patient Instructions (Signed)
The patient is instructed to continue all medications as prescribed. Schedule followup with check out clerk upon leaving the clinic  

## 2013-07-23 LAB — HEPATITIS B SURFACE ANTIBODY,QUALITATIVE: HEP B S AB: POSITIVE — AB

## 2013-07-23 LAB — HEPATITIS A ANTIBODY, TOTAL: Hep A Total Ab: NONREACTIVE

## 2013-07-25 ENCOUNTER — Telehealth: Payer: Self-pay | Admitting: Internal Medicine

## 2013-07-25 NOTE — Telephone Encounter (Signed)
Relevant patient education assigned to patient using Emmi. ° °

## 2013-08-24 ENCOUNTER — Ambulatory Visit: Payer: Medicare Other | Admitting: Internal Medicine

## 2013-08-26 ENCOUNTER — Ambulatory Visit (INDEPENDENT_AMBULATORY_CARE_PROVIDER_SITE_OTHER): Payer: Medicare Other | Admitting: *Deleted

## 2013-08-26 DIAGNOSIS — E538 Deficiency of other specified B group vitamins: Secondary | ICD-10-CM

## 2013-08-26 MED ORDER — CYANOCOBALAMIN 1000 MCG/ML IJ SOLN
1000.0000 ug | Freq: Once | INTRAMUSCULAR | Status: AC
  Administered 2013-08-26: 1000 ug via INTRAMUSCULAR

## 2013-09-24 ENCOUNTER — Ambulatory Visit (INDEPENDENT_AMBULATORY_CARE_PROVIDER_SITE_OTHER): Payer: Medicare Other | Admitting: Family Medicine

## 2013-09-24 ENCOUNTER — Encounter: Payer: Self-pay | Admitting: Family Medicine

## 2013-09-24 VITALS — BP 110/74 | HR 60 | Temp 98.0°F | Resp 16 | Wt 150.8 lb

## 2013-09-24 DIAGNOSIS — M546 Pain in thoracic spine: Secondary | ICD-10-CM

## 2013-09-24 NOTE — Progress Notes (Signed)
Chief Complaint  Patient presents with  . Flank Pain    Rt side , under ribs    HPI:  Acute visit for:  1) R sided back pain: -for several days on and of after sitting in a twisted weird position to type on computer -R back and rib pain, intermittent, worse with pressing on this or certain movements and twisting, better with rest -denies: fevers, chills, malaise, blood in urine, cough, SOB, DOE, nausea, vomiting-short car ride but they stopped frequently and stretched   ROS: See pertinent positives and negatives per HPI.  Past Medical History  Diagnosis Date  . GERD (gastroesophageal reflux disease)   . Allergy   . Hypertension   . Raynaud's disease   . DLE (discoid lupus erythematosus)   . Osteoporosis     Past Surgical History  Procedure Laterality Date  . Cholecystectomy    . Tonsillectomy      Family History  Problem Relation Age of Onset  . Osteoporosis    . Arthritis Mother   . Cancer Father     History   Social History  . Marital Status: Married    Spouse Name: N/A    Number of Children: N/A  . Years of Education: N/A   Social History Main Topics  . Smoking status: Former Smoker    Quit date: 01/13/1985  . Smokeless tobacco: None  . Alcohol Use: Yes  . Drug Use: No  . Sexual Activity: None   Other Topics Concern  . None   Social History Narrative  . None    Current outpatient prescriptions:Artificial Saliva (BIOTENE ORALBALANCE DRY MOUTH) LIQD, Use as directed in the mouth or throat daily.  , Disp: , Rfl: ;  aspirin 81 MG EC tablet, Take 81 mg by mouth daily.  , Disp: , Rfl: ;  Calcium Carbonate Antacid 650 MG tablet, Take 650 mg by mouth daily. , Disp: , Rfl: ;  carvedilol (COREG) 3.125 MG tablet, Pt takes 1/2 tablet daily., Disp: , Rfl:  cholecalciferol (VITAMIN D) 1000 UNITS tablet, Take 3,000 Units by mouth daily. , Disp: , Rfl: ;  Coenzyme Q10 200 MG capsule, Take 200 mg by mouth daily., Disp: , Rfl: ;  cyanocobalamin (,VITAMIN B-12,) 1000  MCG/ML injection, Inject 1 mL (1,000 mcg total) into the muscle once. Please notify pt when ready for pick up, Disp: 10 mL, Rfl: 1;  famotidine (PEPCID) 10 MG tablet, Take 10 mg by mouth daily., Disp: , Rfl:  fish oil-omega-3 fatty acids 1000 MG capsule, Take 2 g by mouth daily. 1920 mg total taken daily, Disp: , Rfl: ;  hydroxychloroquine (PLAQUENIL) 200 MG tablet, Take 100 mg by mouth daily. , Disp: , Rfl: ;  hydrOXYzine (ATARAX/VISTARIL) 25 MG tablet, 1/2 tablet at bedtime, Disp: , Rfl: ;  Magnesium 250 MG TABS, Take 1 tablet by mouth daily., Disp: , Rfl: ;  multivitamin (THERAGRAN) per tablet, Take 1 tablet by mouth daily.  , Disp: , Rfl:  Probiotic Product (PROBIOTIC DAILY PO), Take by mouth., Disp: , Rfl:   EXAM:  Filed Vitals:   09/24/13 1208  BP: 110/74  Pulse: 60  Temp: 98 F (36.7 C)  Resp: 16    Body mass index is 25.86 kg/(m^2).  GENERAL: vitals reviewed and listed above, alert, oriented, appears well hydrated and in no acute distress  HEENT: atraumatic, conjunttiva clear, no obvious abnormalities on inspection of external nose and ears  NECK: no obvious masses on inspection  LUNGS: clear to auscultation  bilaterally, no wheezes, rales or rhonchi, good air movement  CV: HRRR, no peripheral edema  MS: moves all extremities without noticeable abnormality -TTP superficially in soft tissues overlying posterolateral lower ribcage -no RUQ pain -no rash on skin  PSYCH: pleasant and cooperative, no obvious depression or anxiety  ASSESSMENT AND PLAN:  Discussed the following assessment and plan:  Right-sided thoracic back pain  -from exam this is very likely muscular -discussed other potential etiologies both serious and less likely -opted for heat, stretching, supportive care and follow up as eeded -Patient advised to return or notify a doctor immediately if symptoms worsen or persist or new concerns arise.  Patient Instructions  -heat for 15 minutes twice  dialy  -tylenol 500-1000mg  up to 2-3 times daily  -topical sports cream with menthol or capsaicin  -gentle stretching  -follow up as needed if worsens, persists or new concerns     Franck Vinal R.

## 2013-09-24 NOTE — Progress Notes (Signed)
Pre visit review using our clinic review tool, if applicable. No additional management support is needed unless otherwise documented below in the visit note. 

## 2013-09-24 NOTE — Patient Instructions (Signed)
-  heat for 15 minutes twice dialy  -tylenol 500-1000mg  up to 2-3 times daily  -topical sports cream with menthol or capsaicin  -gentle stretching  -follow up as needed if worsens, persists or new concerns

## 2013-09-26 ENCOUNTER — Ambulatory Visit (INDEPENDENT_AMBULATORY_CARE_PROVIDER_SITE_OTHER): Payer: Medicare Other | Admitting: *Deleted

## 2013-09-26 DIAGNOSIS — E538 Deficiency of other specified B group vitamins: Secondary | ICD-10-CM

## 2013-09-26 MED ORDER — CYANOCOBALAMIN 1000 MCG/ML IJ SOLN
1000.0000 ug | Freq: Once | INTRAMUSCULAR | Status: AC
  Administered 2013-09-26: 1000 ug via INTRAMUSCULAR

## 2013-11-25 ENCOUNTER — Ambulatory Visit: Payer: Medicare Other | Admitting: *Deleted

## 2013-11-28 ENCOUNTER — Ambulatory Visit: Payer: Medicare Other | Admitting: *Deleted

## 2013-12-01 ENCOUNTER — Ambulatory Visit (INDEPENDENT_AMBULATORY_CARE_PROVIDER_SITE_OTHER): Payer: Medicare Other | Admitting: *Deleted

## 2013-12-01 DIAGNOSIS — E538 Deficiency of other specified B group vitamins: Secondary | ICD-10-CM

## 2013-12-01 MED ORDER — CYANOCOBALAMIN 1000 MCG/ML IJ SOLN
1000.0000 ug | Freq: Once | INTRAMUSCULAR | Status: AC
  Administered 2013-12-01: 1000 ug via INTRAMUSCULAR

## 2013-12-26 ENCOUNTER — Ambulatory Visit (INDEPENDENT_AMBULATORY_CARE_PROVIDER_SITE_OTHER): Payer: Medicare Other | Admitting: *Deleted

## 2013-12-26 DIAGNOSIS — E538 Deficiency of other specified B group vitamins: Secondary | ICD-10-CM

## 2013-12-28 MED ORDER — CYANOCOBALAMIN 1000 MCG/ML IJ SOLN
1000.0000 ug | Freq: Once | INTRAMUSCULAR | Status: AC
  Administered 2013-12-26: 1000 ug via INTRAMUSCULAR

## 2014-01-03 ENCOUNTER — Ambulatory Visit: Payer: Medicare Other | Admitting: Family Medicine

## 2014-01-03 ENCOUNTER — Ambulatory Visit (INDEPENDENT_AMBULATORY_CARE_PROVIDER_SITE_OTHER): Payer: Medicare Other | Admitting: Family Medicine

## 2014-01-03 ENCOUNTER — Encounter: Payer: Self-pay | Admitting: Family Medicine

## 2014-01-03 ENCOUNTER — Other Ambulatory Visit: Payer: Self-pay | Admitting: Dermatology

## 2014-01-03 VITALS — BP 112/76 | HR 73 | Temp 97.9°F | Ht 64.0 in | Wt 149.2 lb

## 2014-01-03 DIAGNOSIS — L989 Disorder of the skin and subcutaneous tissue, unspecified: Secondary | ICD-10-CM

## 2014-01-03 NOTE — Progress Notes (Signed)
HPI:  Dr. Arnoldo Morale patient work in visit for:  Spot on R leg: -hx DLE - Dr. Arnoldo Morale was treating with plaquenil - per notes advised trial off at last visit - reports is on lower dose but continues -reports: this started about a year or more ago - reports PCP thought was a vein, saw her dermatologist and reports he was concerned and advised to schedule biopsy if not improving which is scheduled in February -reports worsened this last week or so with a little drainage  and she applied neosporin to it yesterday and now has redness around it -denies:itchy, fever, drainae, malaise  ROS: See pertinent positives and negatives per HPI.  Past Medical History  Diagnosis Date  . GERD (gastroesophageal reflux disease)   . Allergy   . Hypertension   . Raynaud's disease   . DLE (discoid lupus erythematosus)   . Osteoporosis     Past Surgical History  Procedure Laterality Date  . Cholecystectomy    . Tonsillectomy      Family History  Problem Relation Age of Onset  . Osteoporosis    . Arthritis Mother   . Cancer Father     History   Social History  . Marital Status: Married    Spouse Name: N/A    Number of Children: N/A  . Years of Education: N/A   Social History Main Topics  . Smoking status: Former Smoker    Quit date: 01/13/1985  . Smokeless tobacco: None  . Alcohol Use: Yes  . Drug Use: No  . Sexual Activity: None   Other Topics Concern  . None   Social History Narrative    Current outpatient prescriptions: Artificial Saliva (BIOTENE ORALBALANCE DRY MOUTH) LIQD, Use as directed in the mouth or throat daily.  , Disp: , Rfl: ;  aspirin 81 MG EC tablet, Take 81 mg by mouth daily.  , Disp: , Rfl: ;  Calcium Carbonate Antacid 650 MG tablet, Take 650 mg by mouth daily. , Disp: , Rfl: ;  carvedilol (COREG) 3.125 MG tablet, Pt takes 1/2 tablet daily., Disp: , Rfl:  cholecalciferol (VITAMIN D) 1000 UNITS tablet, Take 3,000 Units by mouth daily. , Disp: , Rfl: ;  Coenzyme Q10  200 MG capsule, Take 200 mg by mouth daily., Disp: , Rfl: ;  cyanocobalamin (,VITAMIN B-12,) 1000 MCG/ML injection, Inject 1 mL (1,000 mcg total) into the muscle once. Please notify pt when ready for pick up, Disp: 10 mL, Rfl: 1;  famotidine (PEPCID) 10 MG tablet, Take 10 mg by mouth daily., Disp: , Rfl:  fish oil-omega-3 fatty acids 1000 MG capsule, Take 2 g by mouth daily. 1920 mg total taken daily, Disp: , Rfl: ;  hydroxychloroquine (PLAQUENIL) 200 MG tablet, Take 100 mg by mouth daily. , Disp: , Rfl: ;  hydrOXYzine (ATARAX/VISTARIL) 25 MG tablet, 1/2 tablet at bedtime, Disp: , Rfl: ;  Magnesium 250 MG TABS, Take 1 tablet by mouth daily., Disp: , Rfl: ;  multivitamin (THERAGRAN) per tablet, Take 1 tablet by mouth daily.  , Disp: , Rfl:  Probiotic Product (PROBIOTIC DAILY PO), Take by mouth., Disp: , Rfl:   EXAM:  Filed Vitals:   01/03/14 1302  BP: 112/76  Pulse: 73  Temp: 97.9 F (36.6 C)    Body mass index is 25.6 kg/(m^2).  GENERAL: vitals reviewed and listed above, alert, oriented, appears well hydrated and in no acute distress  HEENT: atraumatic, conjunttiva clear, no obvious abnormalities on inspection of external nose and ears  NECK: no obvious masses on inspection  LUNGS: clear to auscultation bilaterally, no wheezes, rales or rhonchi, good air movement  CV: HRRR, no peripheral edema  SKIN: R shin with crusty irr lesion approc 20x14mm with some ulceration with scab formation and surrounding erythema in palm sized area  MS: moves all extremities without noticeable abnormality  PSYCH: pleasant and cooperative, no obvious depression or anxiety  ASSESSMENT AND PLAN:  Discussed the following assessment and plan:  Skin lesion  -concern for neoplasm, vasculitis lesion, pg given chronicity and appearance -cover with abx in case infection though does not appear infected today and not fluctuant area -advised follow up with her dermatologists and she reports she will call them  today -Patient advised to return or notify a doctor immediately if symptoms worsen or persist or new concerns arise.  Patient Instructions  -start the antibiotic in case of infection - stop the neosporin  -call your dermatologist today  -seek care immediately if worsening      Kelly Robles R.

## 2014-01-03 NOTE — Progress Notes (Signed)
Pre visit review using our clinic review tool, if applicable. No additional management support is needed unless otherwise documented below in the visit note. 

## 2014-01-03 NOTE — Patient Instructions (Signed)
-  start the antibiotic in case of infection - stop the neosporin  -call your dermatologist today  -seek care immediately if worsening

## 2014-01-24 ENCOUNTER — Telehealth: Payer: Self-pay | Admitting: Oncology

## 2014-01-24 NOTE — Telephone Encounter (Signed)
Pt aware of np appt. On 02/02/14@1 :30 Referring Dr Wilhemina Bonito DX-R/O Multiple Myeloma

## 2014-01-27 ENCOUNTER — Telehealth: Payer: Self-pay | Admitting: Oncology

## 2014-01-27 NOTE — Telephone Encounter (Signed)
DELIVERED CHART

## 2014-02-02 ENCOUNTER — Ambulatory Visit (HOSPITAL_BASED_OUTPATIENT_CLINIC_OR_DEPARTMENT_OTHER): Payer: Medicare Other

## 2014-02-02 ENCOUNTER — Encounter: Payer: Self-pay | Admitting: Family Medicine

## 2014-02-02 ENCOUNTER — Encounter: Payer: Self-pay | Admitting: Oncology

## 2014-02-02 ENCOUNTER — Telehealth: Payer: Self-pay | Admitting: Oncology

## 2014-02-02 ENCOUNTER — Ambulatory Visit (HOSPITAL_BASED_OUTPATIENT_CLINIC_OR_DEPARTMENT_OTHER): Payer: Medicare Other | Admitting: Oncology

## 2014-02-02 ENCOUNTER — Ambulatory Visit (HOSPITAL_BASED_OUTPATIENT_CLINIC_OR_DEPARTMENT_OTHER): Payer: Self-pay

## 2014-02-02 VITALS — BP 127/70 | HR 70 | Temp 98.6°F | Resp 18 | Ht 64.0 in | Wt 152.8 lb

## 2014-02-02 DIAGNOSIS — L988 Other specified disorders of the skin and subcutaneous tissue: Secondary | ICD-10-CM

## 2014-02-02 DIAGNOSIS — L930 Discoid lupus erythematosus: Secondary | ICD-10-CM

## 2014-02-02 DIAGNOSIS — E859 Amyloidosis, unspecified: Secondary | ICD-10-CM

## 2014-02-02 LAB — BASIC METABOLIC PANEL (CC13)
Anion Gap: 6 meq/L (ref 3–11)
BUN: 8.3 mg/dL (ref 7.0–26.0)
CO2: 26 meq/L (ref 22–29)
Calcium: 9.1 mg/dL (ref 8.4–10.4)
Chloride: 107 meq/L (ref 98–109)
Creatinine: 0.7 mg/dL (ref 0.6–1.1)
EGFR: 88 ml/min/1.73 m2 — ABNORMAL LOW
Glucose: 93 mg/dL (ref 70–140)
Potassium: 3.9 meq/L (ref 3.5–5.1)
Sodium: 139 meq/L (ref 136–145)

## 2014-02-02 LAB — CBC WITH DIFFERENTIAL/PLATELET
BASO%: 0.5 % (ref 0.0–2.0)
Basophils Absolute: 0 10e3/uL (ref 0.0–0.1)
EOS%: 3.8 % (ref 0.0–7.0)
Eosinophils Absolute: 0.2 10e3/uL (ref 0.0–0.5)
HCT: 40.3 % (ref 34.8–46.6)
HGB: 13.3 g/dL (ref 11.6–15.9)
LYMPH%: 27.1 % (ref 14.0–49.7)
MCH: 31.8 pg (ref 25.1–34.0)
MCHC: 33.1 g/dL (ref 31.5–36.0)
MCV: 96.1 fL (ref 79.5–101.0)
MONO#: 0.4 10e3/uL (ref 0.1–0.9)
MONO%: 6.1 % (ref 0.0–14.0)
NEUT#: 3.7 10e3/uL (ref 1.5–6.5)
NEUT%: 62.5 % (ref 38.4–76.8)
Platelets: 212 10e3/uL (ref 145–400)
RBC: 4.19 10e6/uL (ref 3.70–5.45)
RDW: 12.6 % (ref 11.2–14.5)
WBC: 5.9 10e3/uL (ref 3.9–10.3)
lymph#: 1.6 10e3/uL (ref 0.9–3.3)

## 2014-02-02 NOTE — Progress Notes (Signed)
Checked in new patient with no issues. She has appt card and has not traveled.

## 2014-02-02 NOTE — Telephone Encounter (Signed)
Gave avs & calendar for February. °

## 2014-02-02 NOTE — Progress Notes (Signed)
[facility] Patient Consult   Referring MD: Kelly Robles 69 y.o.  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 She reports noting skin lesions at the right lower leg for the past 3 years. These lesions increased in December and she saw Dr. Ronnald Ramp. She was noted to have an indurated reddish-brown plaque measuring 11 mm at the right lower leg and a 1.5 cm light yellow-brown plaque at the left lower leg. The right leg lesion was biopsied on 01/03/2014. The pathology [id number]) revealed Kelly Robles. Strong positivity was noted with A light chain, Robles but much weaker Robles with lambda. [place] red exhibited a faint uptake with strong crystal violet Robles confirming a diagnosis of Kelly.  She reports feeling well. No skin lesions other than the ones noted at the left and right lower leg. The Robles site is slowly healing.  Past Medical History  Diagnosis Date  . GERD (gastroesophageal reflux disease)   . Allergy   . Hypertension   .  G2 P2    . DLE (discoid lupus erythematosus)   . Osteoporosis     Past Surgical History  Procedure Laterality Date  . Cholecystectomy    . Tonsillectomy      Medications: Reviewed  Allergies:  Allergies  Allergen Reactions  . Bupropion Hcl     REACTION: Rash  . Sulfonamide Derivatives     REACTION: Rash    Family history: Her brother had CLL diagnosed in his 9s. A niece had breast cancer. No other family history of cancer.  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 She quit smoking cigarettes 40 years ago. She is a Occupational hygienist user.  . No risk factor for HIV or Kelly.    ROS:   Positives include: she feels "cold "at times, skin lesions at the bilateral lower leg, a right lower leg lesion has resolved   A complete ROS was  otherwise negative.  Physical Exam:  Blood pressure 127/70, pulse 70, temperature 98.6 F (37 C), temperature source Oral, resp. rate 18, height 5' 4" (1.626 m), weight 152 lb 12.8 oz (69.31 kg), SpO2 100 %. HEENT: oropharynx without visible mass, neck without mass, the tongue appears normal Lungs:  clear bilaterally Cardiac: Regular rate and rhytm Abdomen: No hepatosplenic the, nontender, no mass   Vascular:  no leg edema Lymph nodes: No cervical, supra-clavicular, axillary, or inguinal nodes Neurologic: alert and oriented, the motor exam appears intact in the upper and lower extremities  Skin:  healing Robles site at the right lower leg, raised brown/bile at 1.5 cm lesion at the left lower leg. No ecchymoses. Musculoskeletal: no spine tenderness   LAB:  CBC  Lab Results  Component Value Date   WBC 5.9 02/02/2014   HGB 13.3 02/02/2014   HCT 40.3 02/02/2014   MCV 96.1 02/02/2014   PLT 212 02/02/2014   NEUTROABS 3.7 02/02/2014     CMP      Component Value Date/Time   NA 139 02/02/2014 1539   NA 139 02/16/2013 1142   K 3.9 02/02/2014 1539   K 4.0 02/16/2013 1142   CL 104 02/16/2013 1142   CO2 26 02/02/2014 1539   CO2 26 02/16/2013 1142   GLUCOSE 93 02/02/2014 1539   GLUCOSE 82 02/16/2013 1142  BUN 8.3 02/02/2014 1539   BUN 8 02/16/2013 1142   CREATININE 0.7 02/02/2014 1539   CREATININE 0.7 02/16/2013 1142   CALCIUM 9.1 02/02/2014 1539   CALCIUM 9.3 02/16/2013 1142   PROT 7.5 02/16/2013 1142   ALBUMIN 3.5 02/16/2013 1142   AST 22 02/16/2013 1142   ALT 14 02/16/2013 1142   ALKPHOS 64 02/16/2013 1142   BILITOT 0.7 02/16/2013 1142   GFRNONAA 79.75 11/09/2009 1123   GFRAA 93 11/25/2007 1015      Assessment:   1. bilateral lower leg cutaneous lesions, status post a punch Robles of a right leg lesion on 01/03/2014 confirming Kelly Robles, Kelly Robles   2.   Discoid lupus  3.   Kelly B surface  antibody Robles July 2015    Disposition: She has been diagnosed with Kelly and plasma cells involving a skin Robles. We discussed the implications of this Robles and the differential diagnosis. I recommend a workup for a systemic plasma cell dyscrasia. We will check a serum protein electrophoresis, quantitative globulins, serum free light chain analysis, metastatic bone survey, and she will be referred for a diagnostic bone marrow Robles.   Kelly Robles will return for an office visit and further discussion when these results are available.  She does not have systemic symptoms to suggest a diagnosis of multiple myeloma or systemic amyloidosis. A CBC and chemistry panel today are unremarkable.  I noted the Kelly B antibody positivity after she left the office today. We will check a Kelly B surface antigen.  [name], [name] 02/02/2014, 5:41 PM    

## 2014-02-03 ENCOUNTER — Other Ambulatory Visit: Payer: Self-pay | Admitting: *Deleted

## 2014-02-03 DIAGNOSIS — E859 Amyloidosis, unspecified: Secondary | ICD-10-CM

## 2014-02-06 ENCOUNTER — Other Ambulatory Visit: Payer: Self-pay | Admitting: *Deleted

## 2014-02-06 DIAGNOSIS — E859 Amyloidosis, unspecified: Secondary | ICD-10-CM

## 2014-02-06 LAB — PROTEIN ELECTROPHORESIS, SERUM, WITH REFLEX
ALPHA-2-GLOBULIN: 9.8 % (ref 7.1–11.8)
Albumin ELP: 49.8 % — ABNORMAL LOW (ref 55.8–66.1)
Alpha-1-Globulin: 4.9 % (ref 2.9–4.9)
BETA 2: 6.3 % (ref 3.2–6.5)
Beta Globulin: 6 % (ref 4.7–7.2)
Gamma Globulin: 23.2 % — ABNORMAL HIGH (ref 11.1–18.8)
Total Protein, Serum Electrophoresis: 7.6 g/dL (ref 6.0–8.3)

## 2014-02-06 LAB — KAPPA/LAMBDA LIGHT CHAINS
KAPPA FREE LGHT CHN: 3.11 mg/dL — AB (ref 0.33–1.94)
Kappa:Lambda Ratio: 5.65 — ABNORMAL HIGH (ref 0.26–1.65)
Lambda Free Lght Chn: 0.55 mg/dL — ABNORMAL LOW (ref 0.57–2.63)

## 2014-02-06 LAB — IGG, IGA, IGM
IGG (IMMUNOGLOBIN G), SERUM: 1620 mg/dL (ref 690–1700)
IgA: 375 mg/dL (ref 69–380)
IgM, Serum: 238 mg/dL (ref 52–322)

## 2014-02-06 LAB — HEPATITIS B SURFACE ANTIGEN: Hepatitis B Surface Ag: NEGATIVE

## 2014-02-06 LAB — BETA 2 MICROGLOBULIN, SERUM: BETA 2 MICROGLOBULIN: 2.78 mg/L — AB (ref <=2.51)

## 2014-02-08 ENCOUNTER — Ambulatory Visit (HOSPITAL_COMMUNITY)
Admission: RE | Admit: 2014-02-08 | Discharge: 2014-02-08 | Disposition: A | Discharge status: Home / Self Care | Payer: Medicare Other | Source: Ambulatory Visit | Attending: Oncology | Admitting: Oncology

## 2014-02-08 ENCOUNTER — Ambulatory Visit (HOSPITAL_COMMUNITY): Payer: Medicare Other

## 2014-02-08 DIAGNOSIS — E859 Amyloidosis, unspecified: Secondary | ICD-10-CM | POA: Insufficient documentation

## 2014-02-14 DIAGNOSIS — L821 Other seborrheic keratosis: Secondary | ICD-10-CM | POA: Diagnosis not present

## 2014-02-14 DIAGNOSIS — L720 Epidermal cyst: Secondary | ICD-10-CM | POA: Diagnosis not present

## 2014-02-14 DIAGNOSIS — L930 Discoid lupus erythematosus: Secondary | ICD-10-CM | POA: Diagnosis not present

## 2014-02-14 DIAGNOSIS — R21 Rash and other nonspecific skin eruption: Secondary | ICD-10-CM | POA: Diagnosis not present

## 2014-02-14 DIAGNOSIS — E859 Amyloidosis, unspecified: Secondary | ICD-10-CM | POA: Diagnosis not present

## 2014-02-16 ENCOUNTER — Other Ambulatory Visit: Payer: Self-pay | Admitting: Radiology

## 2014-02-16 ENCOUNTER — Ambulatory Visit (HOSPITAL_BASED_OUTPATIENT_CLINIC_OR_DEPARTMENT_OTHER): Payer: Medicare Other | Admitting: Oncology

## 2014-02-16 ENCOUNTER — Telehealth: Payer: Self-pay | Admitting: Oncology

## 2014-02-16 VITALS — BP 126/81 | HR 81 | Temp 97.6°F | Resp 18 | Ht 64.0 in | Wt 152.4 lb

## 2014-02-16 DIAGNOSIS — M3500 Sicca syndrome, unspecified: Secondary | ICD-10-CM | POA: Diagnosis not present

## 2014-02-16 DIAGNOSIS — L930 Discoid lupus erythematosus: Secondary | ICD-10-CM

## 2014-02-16 DIAGNOSIS — L989 Disorder of the skin and subcutaneous tissue, unspecified: Secondary | ICD-10-CM

## 2014-02-16 DIAGNOSIS — E854 Organ-limited amyloidosis: Secondary | ICD-10-CM

## 2014-02-16 DIAGNOSIS — E859 Amyloidosis, unspecified: Secondary | ICD-10-CM

## 2014-02-16 NOTE — Telephone Encounter (Signed)
Pt confirmed MD visit per 02/04 POF, gave pt AVS.... KJ

## 2014-02-16 NOTE — Progress Notes (Signed)
Pt requests to have Dr. Danella Sensing (Dermatologist) and Dr. Luberta Mutter (Opthalmology) to her care team.

## 2014-02-16 NOTE — Progress Notes (Signed)
  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 The bone marrow biopsy has been scheduled for 02/20/2014.  Kelly Robles reports the left leg biopsy site has not healed and now has increased surrounding erythema. She was placed on antibiotic by Dr. Ronnald Ramp earlier this week.  No other complaint.  Objective:  Vital signs in last 24 hours:  Blood pressure 126/81, pulse 81, temperature 97.6 F (36.4 C), temperature source Oral, resp. rate 18, height _0  (1.626 m), weight 152 lb 6.4 oz (69.128 kg), SpO2 100 %.    Resp: Lungs clear bilaterally Cardio: Regular rate and rhythm GI: No hepatosplenomegaly Vascular: No leg edema  Skin: 1.5 cm nodular lesion inferior to the right knee, slightly raised brown 1 cm lesion at the right pretibial region, open wound at the left pretibial biopsy site with surrounding erythema     Lab Results:  Lab Results  Component Value Date   WBC 5.9 02/02/2014   HGB 13.3 02/02/2014   HCT 40.3 02/02/2014   MCV 96.1 02/02/2014   PLT 212 02/02/2014   NEUTROABS 3.7 02/02/2014   creatinine 0.7, calcium 9.1 Beta-2 microglobulin-2.78 Serum protein electrophoresis-no monoclonal protein IgG 1620 IgA 375 IgM 238 kappa free light chains 3.11 Lambda free light chains 0.55  Metastatic bone survey 02/08/2014-negative for myeloma lesions  Medications: I have reviewed the patient's current medications.  Assessment/Plan: 1. 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 Discoid lupus  3. Sjogren's syndrome  4.  Hepatitis B surface antibody positive July 2015-likely related to vaccination, hepatitis B surface antigen negative 02/03/2014  Disposition:  She has  persistent skin lesions at the lower legs with a slowly healing biopsy site. The workup for a  Plasma cell dyscrasia is negative to date other than mild elevation of the serum free kappa light chain and beta-2 microglobulin levels.she will undergo a diagnostic bone marrow biopsy next week.  Ms. Ebling will return for an office visit 03/10/2014 to discuss the indication for systemic treatment versus continued observation.  She will contact  Dr. Ronnald Ramp for increased erythema at the left leg biopsy site.   Betsy Coder, MD  02/16/2014  10:52 AM

## 2014-02-17 ENCOUNTER — Other Ambulatory Visit: Payer: Self-pay | Admitting: Radiology

## 2014-02-20 ENCOUNTER — Encounter (HOSPITAL_COMMUNITY): Payer: Self-pay

## 2014-02-20 ENCOUNTER — Ambulatory Visit (HOSPITAL_COMMUNITY)
Admission: RE | Admit: 2014-02-20 | Discharge: 2014-02-20 | Disposition: A | Discharge status: Home / Self Care | Payer: Medicare Other | Source: Ambulatory Visit | Attending: Oncology | Admitting: Oncology

## 2014-02-20 DIAGNOSIS — D72822 Plasmacytosis: Secondary | ICD-10-CM | POA: Insufficient documentation

## 2014-02-20 DIAGNOSIS — Z79899 Other long term (current) drug therapy: Secondary | ICD-10-CM | POA: Diagnosis not present

## 2014-02-20 DIAGNOSIS — I10 Essential (primary) hypertension: Secondary | ICD-10-CM | POA: Diagnosis not present

## 2014-02-20 DIAGNOSIS — Z7982 Long term (current) use of aspirin: Secondary | ICD-10-CM | POA: Diagnosis not present

## 2014-02-20 DIAGNOSIS — L989 Disorder of the skin and subcutaneous tissue, unspecified: Secondary | ICD-10-CM | POA: Diagnosis not present

## 2014-02-20 DIAGNOSIS — E859 Amyloidosis, unspecified: Secondary | ICD-10-CM | POA: Diagnosis not present

## 2014-02-20 DIAGNOSIS — Z87891 Personal history of nicotine dependence: Secondary | ICD-10-CM | POA: Diagnosis not present

## 2014-02-20 LAB — CBC
HCT: 37.9 % (ref 36.0–46.0)
HEMOGLOBIN: 13.1 g/dL (ref 12.0–15.0)
MCH: 32.9 pg (ref 26.0–34.0)
MCHC: 34.6 g/dL (ref 30.0–36.0)
MCV: 95.2 fL (ref 78.0–100.0)
Platelets: 197 10*3/uL (ref 150–400)
RBC: 3.98 MIL/uL (ref 3.87–5.11)
RDW: 12.5 % (ref 11.5–15.5)
WBC: 4.4 10*3/uL (ref 4.0–10.5)

## 2014-02-20 LAB — BONE MARROW EXAM

## 2014-02-20 LAB — PROTIME-INR
INR: 1 (ref 0.00–1.49)
PROTHROMBIN TIME: 13.3 s (ref 11.6–15.2)

## 2014-02-20 LAB — APTT: aPTT: 27 seconds (ref 24–37)

## 2014-02-20 MED ORDER — FENTANYL CITRATE 0.05 MG/ML IJ SOLN
INTRAMUSCULAR | Status: AC
  Filled 2014-02-20: qty 4

## 2014-02-20 MED ORDER — SODIUM CHLORIDE 0.9 % IV SOLN
INTRAVENOUS | Status: DC
  Administered 2014-02-20: 08:00:00 via INTRAVENOUS

## 2014-02-20 MED ORDER — MIDAZOLAM HCL 2 MG/2ML IJ SOLN
INTRAMUSCULAR | Status: AC | PRN
  Administered 2014-02-20: 1 mg via INTRAVENOUS
  Administered 2014-02-20: 0.5 mg via INTRAVENOUS
  Administered 2014-02-20: 1 mg via INTRAVENOUS

## 2014-02-20 MED ORDER — FENTANYL CITRATE 0.05 MG/ML IJ SOLN
INTRAMUSCULAR | Status: AC | PRN
  Administered 2014-02-20: 50 ug via INTRAVENOUS
  Administered 2014-02-20 (×2): 25 ug via INTRAVENOUS

## 2014-02-20 MED ORDER — MIDAZOLAM HCL 2 MG/2ML IJ SOLN
INTRAMUSCULAR | Status: AC
Start: 2014-02-20 — End: 2014-02-20
  Filled 2014-02-20: qty 6

## 2014-02-20 NOTE — H&P (Signed)
Chief Complaint: "I'm here for a bone marrow biopsy" Referring Physician:[name] HPI: Kelly Robles is an 69 y.o. female with amyloid who is referred for bone marrow biopsy. PMHx, chart, meds reviewed. Has been NPO this am.  Past Medical History:  Past Medical History  Diagnosis Date  . GERD (gastroesophageal reflux disease)   . Allergy   . Hypertension   . Raynaud's disease   . DLE (discoid lupus erythematosus)   . Osteoporosis     Past Surgical History:  Past Surgical History  Procedure Laterality Date  . Cholecystectomy    . Tonsillectomy      Family History:  Family History  Problem Relation Age of Onset  . Osteoporosis    . Arthritis Mother   . Cancer Father     Social History:  reports that she quit smoking about 29 years ago. She does not have any smokeless tobacco history on file. She reports that she drinks alcohol. She reports that she does not use illicit drugs.  Allergies:  Allergies  Allergen Reactions  . Bupropion Hcl     REACTION: Rash  . Sulfonamide Derivatives     REACTION: Rash    Medications:   Medication List    ASK your doctor about these medications        aspirin 81 MG EC tablet  Take 81 mg by mouth daily.     BIOTENE ORALBALANCE DRY MOUTH Liqd  Use as directed in the mouth or throat daily.     calcium citrate-vitamin D 315-200 MG-UNIT per tablet  Commonly known as:  CITRACAL+D  Take 1 tablet by mouth daily.     carvedilol 3.125 MG tablet  Commonly known as:  COREG  Take 1.5625 mg by mouth every evening. Pt takes 1/2 tablet daily.     cephALEXin 500 MG capsule  Commonly known as:  KEFLEX  Take 500 mg by mouth 4 (four) times daily. Started 02/14/14. 10 days course.     cholecalciferol 1000 UNITS tablet  Commonly known as:  VITAMIN D  Take 1,000 Units by mouth 2 (two) times daily.     Coenzyme Q10 200 MG capsule  Take 200 mg by mouth every morning.     cyanocobalamin 1000 MCG tablet  Take 1,000 mcg by mouth every  morning.     famotidine 10 MG tablet  Commonly known as:  PEPCID  Take 10 mg by mouth daily as needed for heartburn.     FISH OIL PO  Take 640-1,280 mg by mouth 2 (two) times daily. 2 in the morning and 1 at night     hydroxychloroquine 200 MG tablet  Commonly known as:  PLAQUENIL  Take 100 mg by mouth every morning.     hydrOXYzine 25 MG tablet  Commonly known as:  ATARAX/VISTARIL  Take 12.5 mg by mouth at bedtime. 1/2 tablet at bedtime     Magnesium 250 MG Tabs  Take 1 tablet by mouth daily.     OVER THE COUNTER MEDICATION  Take 2 tablets by mouth daily. Vegan multivitamin once daily     PROBIOTIC DAILY PO  Take 1 tablet by mouth at bedtime.     SAM-e 400 MG Tabs  Take 600 mg by mouth every morning.     SOOTHE OP  Apply 1 drop to eye 3 (three) times daily as needed (Dry eyes).        Please HPI for pertinent positives, otherwise complete 10 system ROS negative.  Physical Exam: BP 114/70 mmHg  Pulse 61  Temp(Src) 98.2 F (36.8 C) (Oral)  Resp 16  Ht 5' 4"  (1.626 m)  Wt 148 lb (67.132 kg)  BMI 25.39 kg/m2  SpO2 100% Body mass index is 25.39 kg/(m^2).   General Appearance:  Alert, cooperative, no distress, appears stated age  Head:  Normocephalic, without obvious abnormality, atraumatic  ENT: Unremarkable  Neck: Supple, symmetrical, trachea midline  Lungs:   Clear to auscultation bilaterally, no w/r/r, respirations unlabored without use of accessory muscles.  Chest Wall:  No tenderness or deformity  Heart:  Regular rate and rhythm, S1, S2 normal, no murmur, rub or gallop.  Neurologic: Normal affect, no gross deficits.  Labs: Results for orders placed or performed during the hospital encounter of 02/20/14 (from the past 48 hour(s))  APTT upon arrival     Status: None   Collection Time: 02/20/14  7:20 AM  Result Value Ref Range   aPTT 27 24 - 37 seconds  CBC upon arrival     Status: None   Collection Time: 02/20/14  7:20 AM  Result Value Ref Range   WBC  4.4 4.0 - 10.5 K/uL   RBC 3.98 3.87 - 5.11 MIL/uL   Hemoglobin 13.1 12.0 - 15.0 g/dL   HCT 37.9 36.0 - 46.0 %   MCV 95.2 78.0 - 100.0 fL   MCH 32.9 26.0 - 34.0 pg   MCHC 34.6 30.0 - 36.0 g/dL   RDW 12.5 11.5 - 15.5 %   Platelets 197 150 - 400 K/uL  Protime-INR upon arrival     Status: None   Collection Time: 02/20/14  7:20 AM  Result Value Ref Range   Prothrombin Time 13.3 11.6 - 15.2 seconds   INR 1.00 0.00 - 1.49    Imaging: No results found.  Assessment/Plan Amyloid Bone marrow biopsy Explained procedure, risks, complications, use of sedation Labs ok Consent signed in chart  Ascencion Dike PA-C 02/20/2014, 8:27 AM

## 2014-02-20 NOTE — Procedures (Signed)
Procedure:  CT guided bone marrow biopsy Findings:  Right iliac bone marrow aspirate and core biopsy.  No complications.

## 2014-02-20 NOTE — Discharge Instructions (Signed)
Conscious Sedation Sedation is the use of medicines to promote relaxation and relieve discomfort and anxiety. Conscious sedation is a type of sedation. Under conscious sedation you are less alert than normal but are still able to respond to instructions or stimulation. Conscious sedation is used during short medical and dental procedures. It is milder than deep sedation or general anesthesia and allows you to return to your regular activities sooner.  LET [facility] CARE PROVIDER KNOW ABOUT:   Any allergies you have.  All medicines you are taking, including vitamins, herbs, eye drops, creams, and over-the-counter medicines.  Use of steroids (by mouth or creams).  Previous problems you or members of your family have had with the use of anesthetics.  Any blood disorders you have.  Previous surgeries you have had.  Medical conditions you have.  Possibility of pregnancy, if this applies.  Use of cigarettes, alcohol, or illegal drugs. RISKS AND COMPLICATIONS Generally, this is a safe procedure. However, as with any procedure, problems can occur. Possible problems include:  Oversedation.  Trouble breathing on your own. You may need to have a breathing tube until you are awake and breathing on your own.  Allergic reaction to any of the medicines used for the procedure. BEFORE THE PROCEDURE  You may have blood tests done. These tests can help show how well your kidneys and liver are working. They can also show how well your blood clots.  A physical exam will be done.  Only take medicines as directed by your health care provider. You may need to stop taking medicines (such as blood thinners, aspirin, or nonsteroidal anti-inflammatory drugs) before the procedure.   Do not eat or drink at least 6 hours before the procedure or as directed by your health care provider.  Arrange for a responsible adult, family member, or friend to take you home after the procedure. He or she  should stay with you for at least 24 hours after the procedure, until the medicine has worn off. PROCEDURE   An intravenous (IV) catheter will be inserted into one of your veins. Medicine will be able to flow directly into your body through this catheter. You may be given medicine through this tube to help prevent pain and help you relax.  The medical or dental procedure will be done. AFTER THE PROCEDURE  You will stay in a recovery area until the medicine has worn off. Your blood pressure and pulse will be checked.   Depending on the procedure you had, you may be allowed to go home when you can tolerate liquids and your pain is under control. Document Released: 09/24/2000 Document Revised: 01/04/2013 Document Reviewed: 09/06/2012 [facility] Patient Information 2015 [place], [place]. This information is not intended to replace advice given to you by your health care provider. Make sure you discuss any questions you have with your health care provider.     May remove gauze bandage in 24 hours and shower.   Bone Marrow Aspiration, Bone Marrow Biopsy Care After Read the instructions outlined below and refer to this sheet in the next few weeks. These discharge instructions provide you with general information on caring for yourself after you leave the hospital. Your caregiver may also give you specific instructions. While your treatment has been planned according to the most current medical practices available, unavoidable complications occasionally occur. If you have any problems or questions after discharge, call your caregiver. FINDING OUT THE RESULTS OF YOUR TEST Not all test results are available  during your visit. If your test results are not back during the visit, make an appointment with your caregiver to find out the results. Do not assume everything is normal if you have not heard from your caregiver or the medical facility. It is important for you to follow up on all of your test  results.  HOME CARE INSTRUCTIONS  You have had sedation and may be sleepy or dizzy. Your thinking may not be as clear as usual. For the next 24 hours:  Only take over-the-counter or prescription medicines for pain, discomfort, and or fever as directed by your caregiver.  Do not drink alcohol.  Do not smoke.  Do not drive.  Do not make important legal decisions.  Do not operate heavy machinery.  Do not care for small children by yourself.  Keep your dressing clean and dry. You may replace dressing with a bandage after 24 hours.  You may take a bath or shower after 24 hours.  Use an ice pack for 20 minutes every 2 hours while awake for pain as needed. SEEK MEDICAL CARE IF:   There is redness, swelling, or increasing pain at the biopsy site.  There is pus coming from the biopsy site.  There is drainage from a biopsy site lasting longer than one day.  An unexplained oral temperature above 102 F (38.9 C) develops. SEEK IMMEDIATE MEDICAL CARE IF:   You develop a rash.  You have difficulty breathing.  You develop any reaction or side effects to medications given. Document Released: 07/19/2004 Document Revised: 03/24/2011 Document Reviewed: 12/28/2007 [facility] Patient Information 2015 [place], [place]. This information is not intended to replace advice given to you by your health care provider. Make sure you discuss any questions you have with your health care provider.

## 2014-02-23 ENCOUNTER — Telehealth: Payer: Self-pay | Admitting: *Deleted

## 2014-02-23 NOTE — Telephone Encounter (Signed)
THIS NOTE TO DR. [name]'S NURSE, TANYA WHITLOCK,RN.

## 2014-02-23 NOTE — Telephone Encounter (Signed)
-----   Message from Ladell Pier, MD sent at 02/22/2014  7:15 PM EST ----- Please call patient, bone marrow does not show myeloma or amyloid, slight nonspecific plasmacytosis, f/u as scheduled Copy report to Dr. Wilhemina Bonito

## 2014-02-24 DIAGNOSIS — H2513 Age-related nuclear cataract, bilateral: Secondary | ICD-10-CM | POA: Diagnosis not present

## 2014-02-24 DIAGNOSIS — Z79899 Other long term (current) drug therapy: Secondary | ICD-10-CM | POA: Diagnosis not present

## 2014-02-24 DIAGNOSIS — H5203 Hypermetropia, bilateral: Secondary | ICD-10-CM | POA: Diagnosis not present

## 2014-03-01 LAB — CHROMOSOME ANALYSIS, BONE MARROW

## 2014-03-06 ENCOUNTER — Encounter: Payer: Self-pay | Admitting: Family Medicine

## 2014-03-06 ENCOUNTER — Ambulatory Visit (INDEPENDENT_AMBULATORY_CARE_PROVIDER_SITE_OTHER): Payer: Medicare Other | Admitting: Family Medicine

## 2014-03-06 VITALS — BP 118/70 | HR 75 | Temp 98.0°F | Ht 62.25 in | Wt 150.6 lb

## 2014-03-06 DIAGNOSIS — K219 Gastro-esophageal reflux disease without esophagitis: Secondary | ICD-10-CM

## 2014-03-06 DIAGNOSIS — D518 Other vitamin B12 deficiency anemias: Secondary | ICD-10-CM | POA: Diagnosis not present

## 2014-03-06 DIAGNOSIS — I10 Essential (primary) hypertension: Secondary | ICD-10-CM | POA: Diagnosis not present

## 2014-03-06 DIAGNOSIS — G4700 Insomnia, unspecified: Secondary | ICD-10-CM

## 2014-03-06 DIAGNOSIS — R002 Palpitations: Secondary | ICD-10-CM

## 2014-03-06 MED ORDER — HYDROXYZINE HCL 10 MG PO TABS: 10.0000 mg | ORAL_TABLET | Freq: Three times a day (TID) | ORAL | Status: DC | PRN

## 2014-03-06 NOTE — Patient Instructions (Addendum)
BEFORE YOU LEAVE: -please schedule your medicare wellness visit  -follow up with your oncologist and dermatologist as planned  -stop the coreg and see how this goes  -schedule mammogram   FOR IMPROVED SLEEP AND TO RESET YOUR SLEEP SCHEDULE: []  exercise 30 minutes daily  []  go to bed and wake up at the same time  []  keep bedroom cool, dark and quiet  []  reserve bed for sleep - do not read, watch TV, etc in bed  []  If you toss and turn more then 15-20 minutes get out of bed and list thoughts/do quite activity then go back to bed; repeat as needed; do not worry about when you eventually fall asleep - still get up at the same time and turn on lights and take shower  [] get counseling  []  some people find that a half dose of benadryl, melatonin, tylenol pm or unisom on a few nights per week is helpful initially for a few weeks  [] seek help and treat any depression or anxiety  [] prescription strength sleep medications should only be used in severe cases of insomnia if other measures fail and should be used sparingly

## 2014-03-06 NOTE — Progress Notes (Addendum)
HPI:  Kelly Robles is here to establish care. Used to see Dr. Arnoldo Morale.  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 - recent bone marrow biopsy ok and she is scheduled to follow up with the oncologist to  Discussed -CMP, cbc, lipids, tsh, b12, vit d all normal -on plaquenil -hx of Sjogren's  Hx of B12 def: -reports oncologist told her to do sublingual instead of injs -she prefers this  Insomnia: -takes one half dose of vistaril  -no weakness, ams, depression  GERD: -takes pepcid prn -no trouble swallowing   ROS negative for unless reported above: fevers, unintentional weight loss, hearing or vision loss, chest pain, palpitations, struggling to breath, hemoptysis, melena, hematochezia, hematuria, falls, loc, si, thoughts of self harm  Past Medical History  Diagnosis Date  . GERD (gastroesophageal reflux disease)   . Allergy   . Hypertension   . Raynaud's disease   . DLE (discoid lupus erythematosus)   . Osteoporosis   . VARICOSE VEINS, LOWER EXTREMITIES 09/29/2006    Qualifier: Diagnosis of  By: Arnoldo Morale MD, Balinda Quails   . Irritable bowel syndrome 07/07/2006    Qualifier: Diagnosis of  By: Tiney Rouge CMA, Ellison Hughs    . COLONIC POLYPS, ADENOMATOUS 10/26/2003    Qualifier: Diagnosis of  By: Arnoldo Morale MD, [place] DISEASE 07/07/2006    Qualifier: Diagnosis of  By: Lebron Conners, Ellison Hughs      Past Surgical History  Procedure Laterality Date  . Cholecystectomy    . Tonsillectomy      Family History  Problem Relation Age of Onset  . Osteoporosis    . Arthritis Mother   . Cancer Father     History   Social History  . Marital Status: Married    Spouse Name: N/A  . Number of Children: N/A  . Years of Education: N/A   Social History Main Topics  . Smoking status: Former Smoker    Quit date: 01/13/1985  . Smokeless tobacco: Not on file  . Alcohol Use: Yes  . Drug Use: No  . Sexual Activity: Not on file   Other Topics Concern  . None   Social History Narrative   Married, husband Jenny Reichmann for 47+ years   #2 grown children   #3 grandchildren   Retired from Science writer work, Pharmacologist.   Works part-time at [employer] in [place]     Current outpatient prescriptions:  .  Artificial Saliva (BIOTENE ORALBALANCE DRY MOUTH) LIQD, Use as directed in the mouth or throat daily.  , Disp: , Rfl:  .  aspirin 81 MG EC tablet, Take 81 mg by mouth daily.  , Disp: , Rfl:  .  calcium citrate-vitamin D (CITRACAL+D) 315-200 MG-UNIT per tablet, Take 1 tablet by mouth daily., Disp: , Rfl:  .  cholecalciferol (VITAMIN D) 1000 UNITS tablet, Take 1,000 Units by mouth 2 (two) times daily. , Disp: , Rfl:  .  Coenzyme Q10 200 MG capsule, Take 200 mg by mouth every morning. , Disp: , Rfl:  .  cyanocobalamin 1000 MCG tablet, Take 1,000 mcg by mouth every morning. , Disp: , Rfl:  .  famotidine (PEPCID) 10 MG  tablet, Take 10 mg by mouth daily as needed for heartburn. , Disp: , Rfl:  .  hydroxychloroquine (PLAQUENIL) 200 MG tablet, Take 100 mg by mouth every morning. , Disp: , Rfl:  .  Magnesium 250 MG TABS, Take 1 tablet by mouth daily., Disp: , Rfl:  .  Omega-3 Fatty Acids (FISH OIL PO), Take 640-1,280 mg by mouth 2 (two) times daily. 2 in the morning and 1 at night, Disp: , Rfl:  .  OVER THE COUNTER MEDICATION, Take 2 tablets by mouth daily. Vegan multivitamin once daily, Disp: , Rfl:  .  Probiotic Product (PROBIOTIC DAILY PO), Take 1 tablet by mouth at bedtime. , Disp: , Rfl:  .  Propylene Glycol-Glycerin (SOOTHE OP), Apply 1 drop to eye 3 (three) times daily as needed (Dry eyes)., Disp: , Rfl:  .  S-Adenosylmethionine (SAM-E) 400 MG TABS, Take 600 mg by  mouth every morning. , Disp: , Rfl:  .  hydrOXYzine (ATARAX/VISTARIL) 10 MG tablet, Take 1 tablet (10 mg total) by mouth 3 (three) times daily as needed., Disp: 30 tablet, Rfl: 1  EXAM:  Filed Vitals:   03/06/14 1130  BP: 118/70  Pulse: 75  Temp: 98 F (36.7 C)    Body mass index is 27.33 kg/(m^2).  GENERAL: vitals reviewed and listed above, alert, oriented, appears well hydrated and in no acute distress  HEENT: atraumatic, conjunttiva clear, no obvious abnormalities on inspection of external nose and ears  NECK: no obvious masses on inspection  LUNGS: clear to auscultation bilaterally, no wheezes, rales or rhonchi, good air movement  CV: HRRR, no peripheral edema  MS: moves all extremities without noticeable abnormality  PSYCH: pleasant and cooperative, no obvious depression or anxiety  ASSESSMENT AND PLAN:  Discussed the following assessment and plan:  Insomnia -advised slow wean off atarax as tolerated  ANEMIA, B12 DEFICIENCY -sublingual b12  Essential hypertension -stop coreg  Gastroesophageal reflux disease, esophagitis presence not specified -stable  PALPITATIONS, OCCASIONAL -stop coreg per her wishes to see how this goes  SKIN LESION, amyloid of skin biopsy, DLE: -managed by onc and derm -curious to see if this is chronic nodular cutaneous amyloidosis - rare, usually fairly benign per quick review of literature, with rare progression to systemic disease   -We reviewed the PMH, PSH, FH, SH, Meds and Allergies. -We provided refills for any medications we will prescribe as needed. -We addressed current concerns per orders and patient instructions. -We have asked for records for pertinent exams, studies, vaccines and notes from previous providers. -We have advised patient to follow up per instructions below.   -Patient advised to return or notify a doctor immediately if symptoms worsen or persist or new concerns arise.  Patient Instructions  BEFORE  YOU LEAVE: -please schedule your medicare wellness visit  -follow up with your oncologist and dermatologist as planned  -stop the coreg and see how this goes  -schedule mammogram   FOR IMPROVED SLEEP AND TO RESET YOUR SLEEP SCHEDULE: []  exercise 30 minutes daily  []  go to bed and wake up at the same time  []  keep bedroom cool, dark and quiet  []  reserve bed for sleep - do not read, watch TV, etc in bed  []  If you toss and turn more then 15-20 minutes get out of bed and list thoughts/do quite activity then go back to bed; repeat as needed; do not worry about when you eventually fall asleep - still get up at the same time and turn on lights  and take shower  [] get counseling  []  some people find that a half dose of benadryl, melatonin, tylenol pm or unisom on a few nights per week is helpful initially for a few weeks  [] seek help and treat any depression or anxiety  [] prescription strength sleep medications should only be used in severe cases of insomnia if other measures fail and should be used sparingly        KIM, Jarrett Soho R.

## 2014-03-06 NOTE — Progress Notes (Signed)
Pre visit review using our clinic review tool, if applicable. No additional management support is needed unless otherwise documented below in the visit note. 

## 2014-03-10 ENCOUNTER — Ambulatory Visit (HOSPITAL_BASED_OUTPATIENT_CLINIC_OR_DEPARTMENT_OTHER): Payer: Medicare Other | Admitting: Oncology

## 2014-03-10 ENCOUNTER — Telehealth: Payer: Self-pay | Admitting: Oncology

## 2014-03-10 VITALS — BP 111/66 | HR 65 | Temp 97.8°F | Resp 18 | Ht 62.25 in | Wt 151.6 lb

## 2014-03-10 DIAGNOSIS — L930 Discoid lupus erythematosus: Secondary | ICD-10-CM | POA: Diagnosis not present

## 2014-03-10 DIAGNOSIS — E859 Amyloidosis, unspecified: Secondary | ICD-10-CM

## 2014-03-10 DIAGNOSIS — M3500 Sicca syndrome, unspecified: Secondary | ICD-10-CM | POA: Diagnosis not present

## 2014-03-10 NOTE — Progress Notes (Signed)
  [facility] OFFICE PROGRESS NOTE   Diagnosis: Amyloidosis  INTERVAL HISTORY:   [name] returns as scheduled. The left leg biopsy site is now healing. No new skin lesions. She underwent a bone marrow biopsy 02/20/2014. She tolerated procedure well. The pathology revealed a slightly hypercellular marrow with 7% plasma cells. The plasma cells were polyclonal on immunohistochemical stains. A Congo red stain was negative for amyloid.  Objective:  Vital signs in last 24 hours:  Blood pressure 111/66, pulse 65, temperature 97.8 F (36.6 C), temperature source Oral, resp. rate 18, height 5' 2.25" (1.581 m), weight 151 lb 9.6 oz (68.765 kg), SpO2 100 %.  Resp: Lungs clear bilaterally Cardio: Regular rate and rhythm GI: No hepatosplenomegaly Vascular: No leg edema  Skin: Approximate 2 cm white purple nodular lesion inferior to the right knee, flat brown plaque-like lesion at the right tibia, healing punch biopsy site at the left tibia, flat brown/tan lesion at the left tibia   Portacath/PICC-without erythema  Lab Results:  Lab Results  Component Value Date   WBC 4.4 02/20/2014   HGB 13.1 02/20/2014   HCT 37.9 02/20/2014   MCV 95.2 02/20/2014   PLT 197 02/20/2014   NEUTROABS 3.7 02/02/2014    Medications: I have reviewed the patient's current medications.  Assessment/Plan: 1. 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 Discoid lupus  3. Sjogren's syndrome  4. 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 A systemic evaluation for multiple myeloma is negative. 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  I discussed treatment options with [name] and her husband. We decided to follow her with close observation. We will initiate systemic therapy if she develops additional skin lesions or systemic symptoms to suggest a plasma cell dyscrasia.  Kelly Robles will return for an office and lab visit in 3 months.  Betsy Coder, MD  03/10/2014  9:38 AM

## 2014-03-10 NOTE — Telephone Encounter (Signed)
Pt confirmed labs/ov per 02/26 POF, gave pt AVS..... KJ °

## 2014-03-13 ENCOUNTER — Encounter: Payer: Medicare Other | Admitting: Family Medicine

## 2014-03-15 DIAGNOSIS — H43811 Vitreous degeneration, right eye: Secondary | ICD-10-CM | POA: Diagnosis not present

## 2014-05-16 DIAGNOSIS — L930 Discoid lupus erythematosus: Secondary | ICD-10-CM | POA: Diagnosis not present

## 2014-05-16 DIAGNOSIS — L814 Other melanin hyperpigmentation: Secondary | ICD-10-CM | POA: Diagnosis not present

## 2014-05-16 DIAGNOSIS — L821 Other seborrheic keratosis: Secondary | ICD-10-CM | POA: Diagnosis not present

## 2014-05-16 DIAGNOSIS — D225 Melanocytic nevi of trunk: Secondary | ICD-10-CM | POA: Diagnosis not present

## 2014-05-23 ENCOUNTER — Ambulatory Visit (HOSPITAL_BASED_OUTPATIENT_CLINIC_OR_DEPARTMENT_OTHER): Payer: Medicare Other | Admitting: Nurse Practitioner

## 2014-05-23 ENCOUNTER — Telehealth: Payer: Self-pay | Admitting: Oncology

## 2014-05-23 ENCOUNTER — Telehealth: Payer: Self-pay | Admitting: *Deleted

## 2014-05-23 ENCOUNTER — Other Ambulatory Visit (HOSPITAL_BASED_OUTPATIENT_CLINIC_OR_DEPARTMENT_OTHER): Payer: Medicare Other

## 2014-05-23 VITALS — BP 113/67 | HR 66 | Temp 98.8°F | Resp 18 | Wt 150.5 lb

## 2014-05-23 DIAGNOSIS — E859 Amyloidosis, unspecified: Secondary | ICD-10-CM

## 2014-05-23 DIAGNOSIS — T148 Other injury of unspecified body region: Secondary | ICD-10-CM

## 2014-05-23 DIAGNOSIS — E858 Other amyloidosis: Secondary | ICD-10-CM | POA: Diagnosis not present

## 2014-05-23 DIAGNOSIS — T148XXA Other injury of unspecified body region, initial encounter: Secondary | ICD-10-CM

## 2014-05-23 LAB — CBC WITH DIFFERENTIAL/PLATELET
BASO%: 1.3 % (ref 0.0–2.0)
Basophils Absolute: 0.1 10*3/uL (ref 0.0–0.1)
EOS%: 2.3 % (ref 0.0–7.0)
Eosinophils Absolute: 0.1 10*3/uL (ref 0.0–0.5)
HEMATOCRIT: 39.3 % (ref 34.8–46.6)
HEMOGLOBIN: 13.5 g/dL (ref 11.6–15.9)
LYMPH%: 30.9 % (ref 14.0–49.7)
MCH: 32.3 pg (ref 25.1–34.0)
MCHC: 34.2 g/dL (ref 31.5–36.0)
MCV: 94.3 fL (ref 79.5–101.0)
MONO#: 0.4 10*3/uL (ref 0.1–0.9)
MONO%: 7.1 % (ref 0.0–14.0)
NEUT#: 3.2 10*3/uL (ref 1.5–6.5)
NEUT%: 58.4 % (ref 38.4–76.8)
Platelets: 217 10*3/uL (ref 145–400)
RBC: 4.17 10*6/uL (ref 3.70–5.45)
RDW: 12.7 % (ref 11.2–14.5)
WBC: 5.4 10*3/uL (ref 3.9–10.3)
lymph#: 1.7 10*3/uL (ref 0.9–3.3)

## 2014-05-23 NOTE — Telephone Encounter (Signed)
Pt confirmed labs/ov per 05/10 POF, gave pt AVS and Calendar.... KJ  °

## 2014-05-23 NOTE — Telephone Encounter (Signed)
TC from patient stating that she has a new, rather large bruise on her Left arm -about 3" across with a hard center. Plus she sees a new bruise perhaps forming on another place near her elbow. Her concern is if it related to her amyloid diagnosis. She would like to have Dr. Benay Spice or someone look at it today.  Scheduled her to see Selena Lesser, NP in [facility] at 4:00pm, with labs at 3:45 pm (CBC)

## 2014-05-26 ENCOUNTER — Encounter: Payer: Self-pay | Admitting: Nurse Practitioner

## 2014-05-26 DIAGNOSIS — E859 Amyloidosis, unspecified: Secondary | ICD-10-CM | POA: Insufficient documentation

## 2014-05-26 DIAGNOSIS — T148XXA Other injury of unspecified body region, initial encounter: Secondary | ICD-10-CM | POA: Insufficient documentation

## 2014-05-26 NOTE — Assessment & Plan Note (Signed)
Patient has an approximately 3 cm in diameter healing hematoma to the posterior left forearm.  She denies any known injury or trauma to her arm.  She also has a healing bruise approximately 1 cm in diameter to her left elbow region.  These bruising/hematoma sites do not appear to be the same as patient's amyloidosis patches.  Advised patient to let us know if they do not continue to heal.

## 2014-05-26 NOTE — Progress Notes (Signed)
SYMPTOM [facility]NA[facility]EMENT [facility]   HPI: Kelly Robles 69 y.o. female diagnosed with amyloidosis.  Currently undergoing observation only.  Patient has an approximately 3 cm in diameter healing hematoma to the posterior left forearm.  She denies any known injury or trauma to her arm.  She also has a healing bruise approximately 1 cm in diameter to her left elbow region.  These bruising/hematoma sites do not appear to be the same as patient's amyloidosis patches.  She denies any other new issues whatsoever.  She denies any recent fevers or chills.  HPI  ROS  Past Medical History  Diagnosis Date  . GERD (gastroesophageal reflux disease)   . Allergy   . Hypertension   . Raynaud's disease   . DLE (discoid lupus erythematosus)   . Osteoporosis   . VARICOSE VEINS, LOWER EXTREMITIES 09/29/2006    Qualifier: Diagnosis of  By: Arnoldo Morale MD, Balinda Quails   . Irritable bowel syndrome 07/07/2006    Qualifier: Diagnosis of  By: Tiney Rouge CMA, Ellison Hughs    . COLONIC POLYPS, ADENOMATOUS 10/26/2003    Qualifier: Diagnosis of  By: Arnoldo Morale MD, [place] DISEASE 07/07/2006    Qualifier: Diagnosis of  By: Lebron Conners, Ellison Hughs      Past Surgical History  Procedure Laterality Date  . Cholecystectomy    . Tonsillectomy      has ANEMIA, B12 DEFICIENCY; Essential hypertension; ALLERGIC RHINITIS; GERD; LUPUS ERYTHEMATOSUS, DISCOID; OSTEOPOROSIS; Amyloidosis; and Hematoma on her problem list.    is allergic to bupropion hcl and sulfonamide derivatives.    Medication List       This list is accurate as of: 05/23/14 11:59 PM.  Always use your most recent med list.               aspirin 81 MG EC tablet  Take 81 mg by mouth daily.     BIOTENE ORALBALANCE DRY MOUTH Liqd  Use as directed in the mouth or throat daily.     calcium citrate-vitamin D 315-200 MG-UNIT per tablet  Commonly known as:  CITRACAL+D  Take 1 tablet by mouth daily.     carvedilol 3.125 MG tablet  Commonly known as:   COREG  Take 1.56 mg by mouth daily.     cholecalciferol 1000 UNITS tablet  Commonly known as:  VITAMIN D  Take 1,000 Units by mouth 2 (two) times daily.     Coenzyme Q10 200 MG capsule  Take 200 mg by mouth every morning.     cyanocobalamin 1000 MCG tablet  Take 1,000 mcg by mouth every morning.     famotidine 10 MG tablet  Commonly known as:  PEPCID  Take 10 mg by mouth daily as needed for heartburn.     FISH OIL PO  Take 640-1,280 mg by mouth 2 (two) times daily. 2 in the morning and 1 at night     hydroxychloroquine 200 MG tablet  Commonly known as:  PLAQUENIL  Take 100 mg by mouth every morning.     hydrOXYzine 10 MG tablet  Commonly known as:  ATARAX/VISTARIL  Take 1 tablet (10 mg total) by mouth 3 (three) times daily as needed.     Magnesium 250 MG Tabs  Take 1 tablet by mouth daily.     OVER THE COUNTER MEDICATION  Take 2 tablets by mouth daily. Vegan multivitamin once daily     PROBIOTIC DAILY PO  Take 1 tablet by mouth at bedtime.  SAM-e 400 MG Tabs  Take 600 mg by mouth every morning.     SOOTHE OP  Apply 1 drop to eye 3 (three) times daily as needed (Dry eyes).         PHYSICAL EXAMINATION  Oncology Vitals 05/23/2014 03/10/2014 03/06/2014 02/20/2014 02/20/2014 02/20/2014 02/20/2014  Height - 158 cm 158 cm - - - -  Weight 68.266 kg 68.765 kg 68.312 kg - - - -  Weight (lbs) 150 lbs 8 oz 151 lbs 10 oz 150 lbs 10 oz - - - -  BMI (kg/m2) - 27.51 kg/m2 27.32 kg/m2 - - - -  Temp 98.8 97.8 98 - 97.9 - 98.2  Pulse 66 65 75 70 61 68 82  Resp 18 18 - 16 16 16 16   SpO2 - 100 - 99 98 97 97  BSA (m2) - 1.74 m2 1.73 m2 - - - -   BP Readings from Last 3 Encounters:  05/23/14 113/67  03/10/14 111/66  03/06/14 118/70    Physical Exam  Constitutional: She is oriented to person, place, and time and well-developed, well-nourished, and in no distress.  HENT:  Head: Normocephalic and atraumatic.  Eyes: Conjunctivae and EOM are normal. Pupils are equal, round, and  reactive to light. Right eye exhibits no discharge. Left eye exhibits no discharge. No scleral icterus.  Neck: Normal range of motion.  Pulmonary/Chest: Effort normal. No respiratory distress.  Musculoskeletal: Normal range of motion. She exhibits no edema or tenderness.  Neurological: She is alert and oriented to person, place, and time. Gait normal.  Skin: Skin is warm and dry. No rash noted. No erythema. No pallor.  Patient has bilateral lower leg lesions that appear to be light brown patches to the anterior shin areas.  There is no erythema, warmth, edema, or tenderness to the sites.  Patient has an approximately 3 cm in diameter healing hematoma to the posterior left forearm.  She denies any known injury or trauma to her arm.  She also has a healing bruise approximately 1 cm in diameter to her left elbow region.  These bruising/hematoma sites do not appear to be the same as patient's amyloidosis patches.     Psychiatric: Affect normal.  Nursing note and vitals reviewed.   LABORATORY DATA:. Appointment on 05/23/2014  Component Date Value Ref Range Status  . WBC 05/23/2014 5.4  3.9 - 10.3 10e3/uL Final  . NEUT# 05/23/2014 3.2  1.5 - 6.5 10e3/uL Final  . HGB 05/23/2014 13.5  11.6 - 15.9 g/dL Final  . HCT 05/23/2014 39.3  34.8 - 46.6 % Final  . Platelets 05/23/2014 217  145 - 400 10e3/uL Final  . MCV 05/23/2014 94.3  79.5 - 101.0 fL Final  . MCH 05/23/2014 32.3  25.1 - 34.0 pg Final  . MCHC 05/23/2014 34.2  31.5 - 36.0 g/dL Final  . RBC 05/23/2014 4.17  3.70 - 5.45 10e6/uL Final  . RDW 05/23/2014 12.7  11.2 - 14.5 % Final  . lymph# 05/23/2014 1.7  0.9 - 3.3 10e3/uL Final  . MONO# 05/23/2014 0.4  0.1 - 0.9 10e3/uL Final  . Eosinophils Absolute 05/23/2014 0.1  0.0 - 0.5 10e3/uL Final  . Basophils Absolute 05/23/2014 0.1  0.0 - 0.1 10e3/uL Final  . NEUT% 05/23/2014 58.4  38.4 - 76.8 % Final  . LYMPH% 05/23/2014 30.9  14.0 - 49.7 % Final  . MONO% 05/23/2014 7.1  0.0 - 14.0 % Final  .  EOS% 05/23/2014 2.3  0.0 - 7.0 % Final  .  BASO% 05/23/2014 1.3  0.0 - 2.0 % Final     RADIOGRAPHIC STUDIES: No results found.  ASSESSMENT/PLAN:    Amyloidosis Previous note:   Bilateral low leg continues lesions, status post a biopsy of a left leg lesion on 01/03/2014 confirming amyloid with a plasma cell infiltrate  Mild elevation of the serum free kappa light chains  Mild elevation of the beta-2 microglobulin  Bone marrow biopsy 02/20/2014 , 7 percent plasma cells (polyclonal) , negative Congo red stain  Patient continues with bilateral lower leg lesions that appear to be light brown patches to the anterior shin areas.  These areas are nontender, with no erythema, warmth, or edema.  Will continue to monitor closely.  Patient has plans to return on 06/05/2014 for labs.  Patient has plans to return for follow-up visit on 06/09/2014.      Hematoma Patient has an approximately 3 cm in diameter healing hematoma to the posterior left forearm.  She denies any known injury or trauma to her arm.  She also has a healing bruise approximately 1 cm in diameter to her left elbow region.  These bruising/hematoma sites do not appear to be the same as patient's amyloidosis patches.  Advised patient to let us know if they do not continue to heal.   Patient stated understanding of all instructions; and was in agreement with this plan of care. The patient knows to call the clinic with any problems, questions or concerns.   This was a shared visit with Dr. Benay Spice today.   Total time spent with patient was 25 minutes;  with greater than 75 percent of that time spent in face to face counseling regarding patient's symptoms,  and coordination of care and follow up.  Disclaimer: This note was dictated with voice recognition software. Similar sounding words can inadvertently be transcribed and may not be corrected upon review.   Drue Second, NP 05/26/2014   This was a shared visit with  Drue Second. The bruising could be related to amyloidosis.  We will consider stopping aspirin if this persists.  Julieanne Manson, MD

## 2014-05-26 NOTE — Assessment & Plan Note (Signed)
Previous note:   Bilateral low leg continues lesions, status post a biopsy of a left leg lesion on 01/03/2014 confirming amyloid with a plasma cell infiltrate  Mild elevation of the serum free kappa light chains  Mild elevation of the beta-2 microglobulin  Bone marrow biopsy 02/20/2014 , 7 percent plasma cells (polyclonal) , negative Congo red stain  Patient continues with bilateral lower leg lesions that appear to be light brown patches to the anterior shin areas.  These areas are nontender, with no erythema, warmth, or edema.  Will continue to monitor closely.  Patient has plans to return on 06/05/2014 for labs.  Patient has plans to return for follow-up visit on 06/09/2014.

## 2014-06-05 ENCOUNTER — Other Ambulatory Visit (HOSPITAL_BASED_OUTPATIENT_CLINIC_OR_DEPARTMENT_OTHER): Payer: Medicare Other

## 2014-06-05 DIAGNOSIS — E859 Amyloidosis, unspecified: Secondary | ICD-10-CM | POA: Diagnosis not present

## 2014-06-05 LAB — CBC WITH DIFFERENTIAL/PLATELET
BASO%: 0.2 % (ref 0.0–2.0)
Basophils Absolute: 0 10*3/uL (ref 0.0–0.1)
EOS%: 2.1 % (ref 0.0–7.0)
Eosinophils Absolute: 0.1 10*3/uL (ref 0.0–0.5)
HCT: 38.7 % (ref 34.8–46.6)
HGB: 13.5 g/dL (ref 11.6–15.9)
LYMPH%: 20.2 % (ref 14.0–49.7)
MCH: 33.1 pg (ref 25.1–34.0)
MCHC: 34.9 g/dL (ref 31.5–36.0)
MCV: 94.9 fL (ref 79.5–101.0)
MONO#: 0.4 10*3/uL (ref 0.1–0.9)
MONO%: 6.2 % (ref 0.0–14.0)
NEUT#: 4 10*3/uL (ref 1.5–6.5)
NEUT%: 71.3 % (ref 38.4–76.8)
PLATELETS: 179 10*3/uL (ref 145–400)
RBC: 4.08 10*6/uL (ref 3.70–5.45)
RDW: 12.4 % (ref 11.2–14.5)
WBC: 5.6 10*3/uL (ref 3.9–10.3)
lymph#: 1.1 10*3/uL (ref 0.9–3.3)

## 2014-06-05 LAB — BASIC METABOLIC PANEL (CC13)
ANION GAP: 11 meq/L (ref 3–11)
BUN: 9.7 mg/dL (ref 7.0–26.0)
CO2: 23 mEq/L (ref 22–29)
Calcium: 9.1 mg/dL (ref 8.4–10.4)
Chloride: 107 mEq/L (ref 98–109)
Creatinine: 0.7 mg/dL (ref 0.6–1.1)
EGFR: 83 mL/min/{1.73_m2} — AB (ref >90)
Glucose: 73 mg/dl (ref 70–140)
POTASSIUM: 3.9 meq/L (ref 3.5–5.1)
SODIUM: 141 meq/L (ref 136–145)

## 2014-06-06 ENCOUNTER — Ambulatory Visit (INDEPENDENT_AMBULATORY_CARE_PROVIDER_SITE_OTHER): Payer: Medicare Other | Admitting: Family Medicine

## 2014-06-06 ENCOUNTER — Encounter: Payer: Self-pay | Admitting: Family Medicine

## 2014-06-06 VITALS — BP 98/64 | HR 72 | Temp 98.0°F | Ht 62.5 in | Wt 147.4 lb

## 2014-06-06 DIAGNOSIS — I10 Essential (primary) hypertension: Secondary | ICD-10-CM

## 2014-06-06 DIAGNOSIS — Z0000 Encounter for general adult medical examination without abnormal findings: Secondary | ICD-10-CM

## 2014-06-06 DIAGNOSIS — E559 Vitamin D deficiency, unspecified: Secondary | ICD-10-CM

## 2014-06-06 DIAGNOSIS — E538 Deficiency of other specified B group vitamins: Secondary | ICD-10-CM | POA: Diagnosis not present

## 2014-06-06 DIAGNOSIS — L97909 Non-pressure chronic ulcer of unspecified part of unspecified lower leg with unspecified severity: Secondary | ICD-10-CM

## 2014-06-06 DIAGNOSIS — L930 Discoid lupus erythematosus: Secondary | ICD-10-CM

## 2014-06-06 DIAGNOSIS — I83009 Varicose veins of unspecified lower extremity with ulcer of unspecified site: Secondary | ICD-10-CM

## 2014-06-06 DIAGNOSIS — E859 Amyloidosis, unspecified: Secondary | ICD-10-CM

## 2014-06-06 NOTE — Progress Notes (Signed)
Pre visit review using our clinic review tool, if applicable. No additional management support is needed unless otherwise documented below in the visit note. 

## 2014-06-06 NOTE — Patient Instructions (Addendum)
BEFORE YOU LEAVE: -labs -follow up in 6 months  Please set up visit with your gynecologist for the bone health, pelvic and breast health  Elevate legs for 30 minutes twice daily, compression as tolerated and let us know if you need a referral to the vascular doctor  Please see a lawyer and/or go to this website to help you with advanced directives and designating a health care power of attorney so that your wishes will be followed should you become too ill to make your own medical decisions.  [email or web address]  We recommend the following healthy lifestyle measures: - eat a healthy diet consisting of lots of vegetables, fruits, beans, nuts, seeds, healthy meats such as white chicken and fish and whole grains.  - avoid fried foods, starches, sweets, fast food, processed foods, sodas, red meet and other fattening foods.  - get a least 150 minutes of aerobic exercise per week.    Alcohol screening: done     Obesity Screening and counseling: done     STI screening (Hep C if born [birth year]): declined     Tobacco Screening: done  SHINGLES VACCINE: declined      Pneumococcal (PPSV23 -one dose after 64, one before if risk factors), influenza yearly and hepatitis B vaccines (if high risk - end stage renal disease, IV drugs, homosexual men, live in home for mentally retarded, hemophilia receiving factors) ASSESSMENT/PLAN: ppsv23 done, offered prevnar 13, refused      Screening mammograph (yearly if >40) ASSESSMENT/PLAN: advised to do yearly - sees Dr. Nori Riis for this      Screening Pap smear/pelvic exam (q2 years) ASSESSMENT/PLAN: does with Dr. Nori Riis      Colorectal cancer screening (FOBT yearly or flex sig q4y or colonoscopy q10y or barium enema q4y) ASSESSMENT/PLAN : done in 2011      Bone mass measurements(covered q2y if indicated - estrogen def, osteoporosis, hyperparathyroid, vertebral abnormalities, osteoporosis or steroids) ASSESSMENT/PLAN: she sees Dr. Nori Riis for  this      Cardiovascular screening blood tests (lipids q5y) ASSESSMENT/PLAN: done      Diabetes screening tests ASSESSMENT/PLAN: done

## 2014-06-06 NOTE — Progress Notes (Addendum)
Medicare Annual Preventive Care Visit  (initial annual wellness or annual wellness exam)  Concerns and/or follow up today:  HTN, remote hx of palpitations: -meds: HTN - but report BP tends to run low -denies: CP, SOB, DOE  Non-healing lesion on leg/? Hx DLE: -seeing Dr. 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, currently being monitored by her oncologist -CMP, cbc, lipids, tsh, b12, vit d all normal - recent cbc and cmp 5/23 normal -on plaquenil -hx of Sjogren's  Hx of B12 def: -reports oncologist told her to do sublingual instead of injs -she prefers this  Insomnia: -takes one half dose of vistaril  -no weakness, ams, depression  GERD: -takes pepcid prn -no trouble swallowing  Mild Varicose veins: -hx laser surgery in the past -wears compression intermittently -no elevation -wonders if she should see vascular again or wear compression with skin lesions in light of nonhealing lesions she is seeing derm and onc for -denies worsening swelling  Sees Dr. Nori Riis for her pelvic and breast exams and mammograms. She did have several abnormal. She gets mammograms with Dr. Nori Riis.   ROS: negative for report of fevers, unintentional weight loss, vision changes, vision loss, hearing loss or change, chest pain, sob, hemoptysis, melena, hematochezia, hematuria, genital discharge or lesions, falls, bleeding or bruising, loc, thoughts of suicide or self harm, memory loss  1.) Patient-completed health risk assessment  - completed and reviewed, see scanned documentation  2.) Review of Medical History: -PMH, PSH, Family History and current specialty and care providers reviewed and updated and listed below  - see scanned in document in chart and below  Past Medical History  Diagnosis Date  . GERD (gastroesophageal reflux disease)   . Allergy   . Hypertension    . Raynaud's disease   . DLE (discoid lupus erythematosus)   . Osteoporosis   . VARICOSE VEINS, LOWER EXTREMITIES 09/29/2006    Qualifier: Diagnosis of  By: Arnoldo Morale MD, Balinda Quails   . Irritable bowel syndrome 07/07/2006    Qualifier: Diagnosis of  By: Tiney Rouge CMA, Ellison Hughs    . COLONIC POLYPS, ADENOMATOUS 10/26/2003    Qualifier: Diagnosis of  By: Arnoldo Morale MD, [place] DISEASE 07/07/2006    Qualifier: Diagnosis of  By: Lebron Conners, Ellison Hughs      Past Surgical History  Procedure Laterality Date  . Cholecystectomy    . Tonsillectomy      History   Social History  . Marital Status: Married    Spouse Name: N/A  . Number of Children: N/A  . Years of Education: N/A   Occupational History  . Not on file.   Social History Main Topics  . Smoking status: Former Smoker    Quit date: 01/13/1985  . Smokeless tobacco: Not on file  . Alcohol Use: Yes  . Drug Use: No  . Sexual Activity: Not on file   Other Topics Concern  . Not on file   Social History Narrative   Married, husband Jenny Reichmann for 47+ years   #2 grown children   #3 grandchildren   Retired from social work, Pharmacologist.   Works part-time at [employer] in [place]    The patient has a family history of  3.) Review of functional ability and level of safety:  Any difficulty hearing?  NO  History of falling? NO  Any trouble with IADLs -  using a phone, using transportation, grocery shopping, preparing meals, doing housework, doing laundry, taking medications and managing money? NO  Advance Directives? YES  See summary of recommendations in Patient Instructions below.  4.) Physical Exam Filed Vitals:   06/06/14 0939  BP: 98/64  Pulse: 72  Temp: 98 F (36.7 C)   Estimated body mass index is 26.51 kg/(m^2) as calculated from the following:   Height as of this encounter: 5' 2.5" (1.588 m).   Weight as of this encounter: 147 lb 6.4 oz (66.86 kg).  EKG (optional): deferred  General: alert,  appear well hydrated and in no acute distress  HEENT: visual acuity grossly intact  CV: HRRR, varicose veins with out signs of superficial thrombophlebitis or DVT, no swelling of legs  Lungs: CTA bilaterally  Psych: pleasant and cooperative, no obvious depression or anxiety  Cog function grossly intact  SKin: several skin lesions on shins in various stages of healing   See patient instructions for recommendations.  Education and counseling regarding the above review of health provided with a plan for the following: -see scanned patient completed form for further details -fall prevention strategies discussed  -healthy lifestyle discussed -importance and resources for completing advanced directives discussed -see patient instructions below for any other recommendations provided  4)The following written screening schedule of preventive measures were reviewed with assessment and plan made per below, orders and patient instructions:      Alcohol screening: done     Obesity Screening and counseling: done     STI screening (Hep C if born 1947-65): declined     Tobacco Screening: done  SHINGLES VACCINE: declined      Pneumococcal (PPSV23 -one dose after 64, one before if risk factors), influenza yearly and hepatitis B vaccines (if high risk - end stage renal disease, IV drugs, homosexual men, live in home for mentally retarded, hemophilia receiving factors) ASSESSMENT/PLAN: ppsv23 done, offered prevnar 74, refused      Screening mammograph (yearly if >40) ASSESSMENT/PLAN: advised to do yearly - sees Dr. Nori Riis for this      Screening Pap smear/pelvic exam (q2 years) ASSESSMENT/PLAN: does with Dr. Nori Riis      Colorectal cancer screening (FOBT yearly or flex sig q4y or colonoscopy q10y or barium enema q4y) ASSESSMENT/PLAN : done in 2011      Bone mass measurements(covered q2y if indicated - estrogen def, osteoporosis, hyperparathyroid, vertebral abnormalities, osteoporosis or  steroids) ASSESSMENT/PLAN: she sees Dr. Nori Riis for this      Cardiovascular screening blood tests (lipids q5y) ASSESSMENT/PLAN: done      Diabetes screening tests ASSESSMENT/PLAN: done   7.) Summary:  Medicare annual wellness visit, subsequent -risk factors and conditions per above assessment were discussed and treatment, recommendations and referrals were offered per documentation above and orders and patient instructions. -see chart and scanned documentation  Essential hypertension -cont current tx  Vitamin D deficiency - Plan: Vitamin D, 25-hydroxy -she wishes to check levels and will adjust tx as needed  B12 deficiency - Plan: Vitamin B12 -adjust tx as needed  Varicose veins of lower extremities with ulcer, unspecified laterality -elevation, compression and follow up with vascular per her preference to see if anything further to offer in the setting of her poorly healing skin lesions - she report she does not think she needs referral to see vasc but will call if finds she does  Amyloidosis -cont tx with onc and derm  LUPUS ERYTHEMATOSUS, DISCOID -cont tx with specialist  Patient Instructions  BEFORE YOU  LEAVE: -labs -follow up in 6 months  Please set up visit with your gynecologist for the bone health, pelvic and breast health  Elevate legs for 30 minutes twice daily, compression as tolerated and let us know if you need a referral to the vascular doctor  Please see a lawyer and/or go to this website to help you with advanced directives and designating a health care power of attorney so that your wishes will be followed should you become too ill to make your own medical decisions.  Proor.no  We recommend the following healthy lifestyle measures: - eat a healthy diet consisting of lots of vegetables, fruits, beans, nuts, seeds, healthy meats such as white chicken and fish and whole grains.  - avoid fried foods, starches, sweets, fast  food, processed foods, sodas, red meet and other fattening foods.  - get a least 150 minutes of aerobic exercise per week.    Alcohol screening: done     Obesity Screening and counseling: done     STI screening (Hep C if born [birth year]): declined     Tobacco Screening: done  SHINGLES VACCINE: declined      Pneumococcal (PPSV23 -one dose after 64, one before if risk factors), influenza yearly and hepatitis B vaccines (if high risk - end stage renal disease, IV drugs, homosexual men, live in home for mentally retarded, hemophilia receiving factors) ASSESSMENT/PLAN: ppsv23 done, offered prevnar 13, refused      Screening mammograph (yearly if >40) ASSESSMENT/PLAN: advised to do yearly - sees Dr. Nori Riis for this      Screening Pap smear/pelvic exam (q2 years) ASSESSMENT/PLAN: does with Dr. Nori Riis      Colorectal cancer screening (FOBT yearly or flex sig q4y or colonoscopy q10y or barium enema q4y) ASSESSMENT/PLAN : done in 2011      Bone mass measurements(covered q2y if indicated - estrogen def, osteoporosis, hyperparathyroid, vertebral abnormalities, osteoporosis or steroids) ASSESSMENT/PLAN: she sees Dr. Nori Riis for this      Cardiovascular screening blood tests (lipids q5y) ASSESSMENT/PLAN: done      Diabetes screening tests ASSESSMENT/PLAN: done

## 2014-06-07 LAB — KAPPA/LAMBDA LIGHT CHAINS
KAPPA LAMBDA RATIO: 1.55 (ref 0.26–1.65)
Kappa free light chain: 3.29 mg/dL — ABNORMAL HIGH (ref 0.33–1.94)
Lambda Free Lght Chn: 2.12 mg/dL (ref 0.57–2.63)

## 2014-06-07 LAB — BETA 2 MICROGLOBULIN, SERUM: BETA 2 MICROGLOBULIN: 2.56 mg/L — AB (ref <=2.51)

## 2014-06-09 ENCOUNTER — Other Ambulatory Visit: Payer: Self-pay | Admitting: *Deleted

## 2014-06-09 ENCOUNTER — Telehealth: Payer: Self-pay | Admitting: Oncology

## 2014-06-09 ENCOUNTER — Ambulatory Visit (HOSPITAL_BASED_OUTPATIENT_CLINIC_OR_DEPARTMENT_OTHER): Payer: Medicare Other | Admitting: Oncology

## 2014-06-09 VITALS — BP 121/79 | HR 76 | Temp 98.1°F | Resp 17 | Ht 62.5 in | Wt 148.9 lb

## 2014-06-09 DIAGNOSIS — M3500 Sicca syndrome, unspecified: Secondary | ICD-10-CM | POA: Diagnosis not present

## 2014-06-09 DIAGNOSIS — M545 Low back pain: Secondary | ICD-10-CM | POA: Diagnosis not present

## 2014-06-09 DIAGNOSIS — M899 Disorder of bone, unspecified: Secondary | ICD-10-CM

## 2014-06-09 DIAGNOSIS — E859 Amyloidosis, unspecified: Secondary | ICD-10-CM

## 2014-06-09 DIAGNOSIS — L930 Discoid lupus erythematosus: Secondary | ICD-10-CM

## 2014-06-09 NOTE — Telephone Encounter (Signed)
per pof to sch pt appt-gave pt copy of sch °

## 2014-06-09 NOTE — Progress Notes (Signed)
  [place] OFFICE PROGRESS NOTE   Diagnosis: Amyloidosis  INTERVAL HISTORY:   She returns as scheduled. She feels well. The left forearm bruise has healed. She has a small bruise at the left upper arm. No other bleeding. The lesion overlying the right upper tibia is slightly larger.  She reports mid back pain since August 2015. This is improved compared to when she first noted pain in August.  Objective:  Vital signs in last 24 hours:  Blood pressure 121/79, pulse 76, temperature 98.1 F (36.7 C), temperature source Oral, resp. rate 17, height 5' 2.5" (1.588 m), weight 148 lb 14.4 oz (67.541 kg), SpO2 100 %.    HEENT: 2 mm ecchymosis at the right buccal mucosa, no macroglossia Resp: Lungs clear bilaterally Cardio: Regular rate and rhythm GI: No hepatomegaly Vascular: Bilateral lower leg varicosities  Skin: Small resolving ecchymosis at the left upper arm, raised purplish/brown lesions at the lower leg bilaterally. There are 2 approximate 1 cm lesions at the left lower leg and including the biopsy site. 2 right low pretibial lesions measuring approximately 1-1.5 cm. 3 cm raised purplish lesion at the right upper pretibial region   Portacath/PICC-without erythema  Lab Results:  Lab Results  Component Value Date   WBC 5.6 06/05/2014   HGB 13.5 06/05/2014   HCT 38.7 06/05/2014   MCV 94.9 06/05/2014   PLT 179 06/05/2014   NEUTROABS 4.0 06/05/2014   Beta-2 microglobulin 2.56, Or free light chains 3.29, creatinine 0.7, calcium 9.1  Medications: I have reviewed the patient's current medications.  Assessment/Plan: 1. 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  Discoid lupus  3. Sjogren's syndrome  4. Hepatitis B surface antibody positive July 2015-likely related to vaccination, hepatitis B surface antigen negative 02/03/2014    Disposition:  The lesion at the right upper pretibial area is slightly larger. There is no other evidence for progression of the amyloidosis. She does not have systemic symptoms. We decided to continue observation. She will return for an office and lab visit in 3 months.  Betsy Coder, MD  06/09/2014  11:46 AM

## 2014-06-14 LAB — IMMUNOFIXATION ELECTROPHORESIS
IGA: 358 mg/dL (ref 69–380)
IGM, SERUM: 203 mg/dL (ref 52–322)
IgG (Immunoglobin G), Serum: 1690 mg/dL (ref 690–1700)
Total Protein, Serum Electrophoresis: 6.8 g/dL (ref 6.0–8.3)

## 2014-06-29 ENCOUNTER — Telehealth: Payer: Self-pay

## 2014-06-29 NOTE — Telephone Encounter (Signed)
[id number] - [place], [place] - [address]: carvedilol (COREG) 3.125 MG tablet

## 2014-06-30 ENCOUNTER — Telehealth: Payer: Self-pay | Admitting: *Deleted

## 2014-06-30 MED ORDER — CARVEDILOL 3.125 MG PO TABS: 1.5600 mg | ORAL_TABLET | Freq: Every day | ORAL | Status: AC

## 2014-06-30 NOTE — Telephone Encounter (Signed)
Ok to refill for 1 year 

## 2014-06-30 NOTE — Addendum Note (Signed)
Addended by: Westley Hummer B on: 06/30/2014 11:58 AM   Modules accepted: Orders

## 2014-06-30 NOTE — Telephone Encounter (Signed)
Refill sent.

## 2014-06-30 NOTE — Telephone Encounter (Signed)
Patient is requesting a refill of carvedilol 3.125 mg take one tab twice daily with meals.  Okay to fill? [name] [phone]

## 2014-07-04 DIAGNOSIS — N951 Menopausal and female climacteric states: Secondary | ICD-10-CM | POA: Diagnosis not present

## 2014-07-05 ENCOUNTER — Telehealth: Payer: Self-pay | Admitting: Oncology

## 2014-07-05 NOTE — Telephone Encounter (Signed)
Patient called to reschedule August appointments. Per patient due to going out of town moved 8/12 lab to 9/16 and 8/19 f/u to 9/30. Patient has new dates/times.

## 2014-07-19 ENCOUNTER — Encounter: Payer: Self-pay | Admitting: Internal Medicine

## 2014-07-20 ENCOUNTER — Encounter: Payer: Self-pay | Admitting: Internal Medicine

## 2014-08-17 ENCOUNTER — Telehealth: Payer: Self-pay | Admitting: Oncology

## 2014-08-17 NOTE — Telephone Encounter (Signed)
Faxed pt medical records to Dr. Ardeth Perfect [phone]

## 2014-08-25 ENCOUNTER — Other Ambulatory Visit: Payer: Medicare Other

## 2014-08-30 ENCOUNTER — Encounter: Payer: Self-pay | Admitting: Internal Medicine

## 2014-09-01 ENCOUNTER — Ambulatory Visit: Payer: Medicare Other | Admitting: Oncology

## 2014-09-25 ENCOUNTER — Telehealth: Payer: Self-pay | Admitting: Oncology

## 2014-09-25 NOTE — Telephone Encounter (Signed)
Returned Advertising account executive. Patient moved appointment from 09/16 & 09/30 to 10/04 & 10/18 due to going out of town. Patient confirmed appointments

## 2014-09-27 ENCOUNTER — Other Ambulatory Visit: Payer: Self-pay | Admitting: Obstetrics & Gynecology

## 2014-09-28 LAB — CYTOLOGY - PAP

## 2014-09-29 ENCOUNTER — Other Ambulatory Visit: Payer: Medicare Other

## 2014-10-03 ENCOUNTER — Other Ambulatory Visit: Payer: Self-pay | Admitting: Obstetrics & Gynecology

## 2014-10-03 DIAGNOSIS — R928 Other abnormal and inconclusive findings on diagnostic imaging of breast: Secondary | ICD-10-CM

## 2014-10-12 ENCOUNTER — Ambulatory Visit
Admission: RE | Admit: 2014-10-12 | Discharge: 2014-10-12 | Disposition: A | Discharge status: Home / Self Care | Payer: Medicare Other | Source: Ambulatory Visit | Attending: Obstetrics & Gynecology | Admitting: Obstetrics & Gynecology

## 2014-10-12 DIAGNOSIS — R928 Other abnormal and inconclusive findings on diagnostic imaging of breast: Secondary | ICD-10-CM

## 2014-10-13 ENCOUNTER — Ambulatory Visit: Payer: Medicare Other | Admitting: Oncology

## 2014-10-17 ENCOUNTER — Other Ambulatory Visit (HOSPITAL_BASED_OUTPATIENT_CLINIC_OR_DEPARTMENT_OTHER): Payer: Medicare Other

## 2014-10-17 DIAGNOSIS — E859 Amyloidosis, unspecified: Secondary | ICD-10-CM

## 2014-10-17 LAB — CBC WITH DIFFERENTIAL/PLATELET
BASO%: 0.8 % (ref 0.0–2.0)
Basophils Absolute: 0 10*3/uL (ref 0.0–0.1)
EOS%: 2.6 % (ref 0.0–7.0)
Eosinophils Absolute: 0.1 10*3/uL (ref 0.0–0.5)
HCT: 39 % (ref 34.8–46.6)
HGB: 13.4 g/dL (ref 11.6–15.9)
LYMPH#: 1.2 10*3/uL (ref 0.9–3.3)
LYMPH%: 30.7 % (ref 14.0–49.7)
MCH: 32.8 pg (ref 25.1–34.0)
MCHC: 34.2 g/dL (ref 31.5–36.0)
MCV: 95.8 fL (ref 79.5–101.0)
MONO#: 0.3 10*3/uL (ref 0.1–0.9)
MONO%: 7.3 % (ref 0.0–14.0)
NEUT#: 2.4 10*3/uL (ref 1.5–6.5)
NEUT%: 58.6 % (ref 38.4–76.8)
Platelets: 193 10*3/uL (ref 145–400)
RBC: 4.07 10*6/uL (ref 3.70–5.45)
RDW: 12.8 % (ref 11.2–14.5)
WBC: 4 10*3/uL (ref 3.9–10.3)

## 2014-10-17 LAB — BASIC METABOLIC PANEL (CC13)
Anion Gap: 6 mEq/L (ref 3–11)
BUN: 7.5 mg/dL (ref 7.0–26.0)
CALCIUM: 9.5 mg/dL (ref 8.4–10.4)
CO2: 26 meq/L (ref 22–29)
Chloride: 112 mEq/L — ABNORMAL HIGH (ref 98–109)
Creatinine: 0.7 mg/dL (ref 0.6–1.1)
EGFR: 83 mL/min/{1.73_m2} — AB (ref >90)
GLUCOSE: 81 mg/dL (ref 70–140)
POTASSIUM: 4.1 meq/L (ref 3.5–5.1)
Sodium: 144 mEq/L (ref 136–145)

## 2014-10-23 LAB — KAPPA/LAMBDA LIGHT CHAINS
KAPPA LAMBDA RATIO: 1.91 — AB (ref 0.26–1.65)
Kappa free light chain: 2.44 mg/dL — ABNORMAL HIGH (ref 0.33–1.94)
Lambda Free Lght Chn: 1.28 mg/dL (ref 0.57–2.63)

## 2014-10-23 LAB — BETA 2 MICROGLOBULIN, SERUM: BETA 2 MICROGLOBULIN: 2.64 mg/L — AB (ref <=2.51)

## 2014-10-31 ENCOUNTER — Ambulatory Visit (HOSPITAL_BASED_OUTPATIENT_CLINIC_OR_DEPARTMENT_OTHER): Payer: Medicare Other | Admitting: Oncology

## 2014-10-31 ENCOUNTER — Telehealth: Payer: Self-pay | Admitting: Oncology

## 2014-10-31 VITALS — BP 118/61 | HR 64 | Temp 98.6°F | Resp 18 | Ht 62.5 in | Wt 148.7 lb

## 2014-10-31 DIAGNOSIS — M810 Age-related osteoporosis without current pathological fracture: Secondary | ICD-10-CM

## 2014-10-31 DIAGNOSIS — L930 Discoid lupus erythematosus: Secondary | ICD-10-CM

## 2014-10-31 DIAGNOSIS — E859 Amyloidosis, unspecified: Secondary | ICD-10-CM

## 2014-10-31 DIAGNOSIS — M3500 Sicca syndrome, unspecified: Secondary | ICD-10-CM

## 2014-10-31 NOTE — Progress Notes (Signed)
  [place] OFFICE PROGRESS NOTE   Diagnosis: Amyloidosis  INTERVAL HISTORY:   Kelly Robles returns as scheduled. She has noted intermittent episodes of palpitations at rest that resolves spontaneously. She is recently been diagnosed with osteoporosis. She does not want to take medication for the osteoporosis. No change in the leg lesions.  Objective:  Vital signs in last 24 hours:  Blood pressure 118/61, pulse 64, temperature 98.6 F (37 C), temperature source Oral, resp. rate 18, height 5' 2.5" (1.588 m), weight 148 lb 11.2 oz (67.45 kg), SpO2 100 %.    HEENT: Oral cavity without bleeding or bruising Resp: Lungs clear bilaterally Cardio: Regular rate and rhythm GI: No hepatosplenomegaly Vascular: No leg edema  Skin: 2 healed areas with brown discoloration at the left pretibial region. 0.5 cm raised purplish lesion at the upper lateral left ankle. 2-3 cm nodular purplish raised area in the right pretibial region, 2 right low pretibial lesions that appear to be healing with the inferior lesion having an erythematous component inferiorly     Lab Results:  Lab Results  Component Value Date   WBC 4.0 10/17/2014   HGB 13.4 10/17/2014   HCT 39.0 10/17/2014   MCV 95.8 10/17/2014   PLT 193 10/17/2014   NEUTROABS 2.4 10/17/2014   Kappa free light chains 2.44, lambda free light chains 1.28  Potassium 4.1, creatinine 0.7  Medications: I have reviewed the patient's current medications.  Assessment/Plan: 1. Bilateral low leg continues lesions, status post a biopsy of a left leg lesion on 01/03/2014 confirming amyloid with a plasma cell infiltrate  Mild elevation of the serum free kappa light chains  Mild elevation of the beta-2 microglobulin  Bone marrow biopsy 02/20/2014 , 7 percent plasma cells (polyclonal) , negative Congo red stain 2. Discoid lupus  3. Sjogren's syndrome  4. Hepatitis B surface antibody positive July 2015-likely related to vaccination,  hepatitis B surface antigen negative 02/03/2014  5. Osteoporosis    Kelly Robles appears stable. There is no evidence for systemic progression of the amyloidosis. The skin lesions at the lower legs appear unchanged other than a possible new lesion near the left ankle (she reports this has been present in the past). She plans to follow-up with Dr. Ardeth Perfect for management of osteoporosis and to evaluate palpitations. She will return for an office and lab visit in 6 months. She continues dermatology follow-up with Dr. Ronnald Ramp.    Kelly Coder, MD  10/31/2014  1:01 PM

## 2014-10-31 NOTE — Telephone Encounter (Signed)
Gave adn printed appt sched and avs for pt for April 2017 °

## 2015-02-04 IMAGING — US US RADIOLOGIST EVAL AND MGMT
1 series · 13 of 16 positions shown · non-contrast
Comparison: none

ESTABLISHED PATIENT OFFICE VISIT - LEVEL III ([id number])

Chief Complaint:  Recurrent left lower extremity varicosities,
history of G S V venous insufficiency and varicose veins.
HISTORY: 66-year female with a prior history of right G S V
transcatheter laser occlusion and sclerotherapy.  Currently the
right lower extremity is not symptomatic.  She presents for
reevaluation of the left lower extremity.  Over the last several
months she has developed medial calf and tibial sub surface
varicosities.  She has had previous injection sclerotherapy in the
left lower extremity.  The left calf tibial region is now mildly
tender which worsens throughout the day.  No evidence of
thrombophlebitis.  Mild restless leg syndrome has recurred.  She
remains active and has lost 50 pounds.  Currently she has not been
wearing compression stockings.

[Series 1: us radiologist eval and mgmt · 13 of 33 slices shown]
[im 1/33]
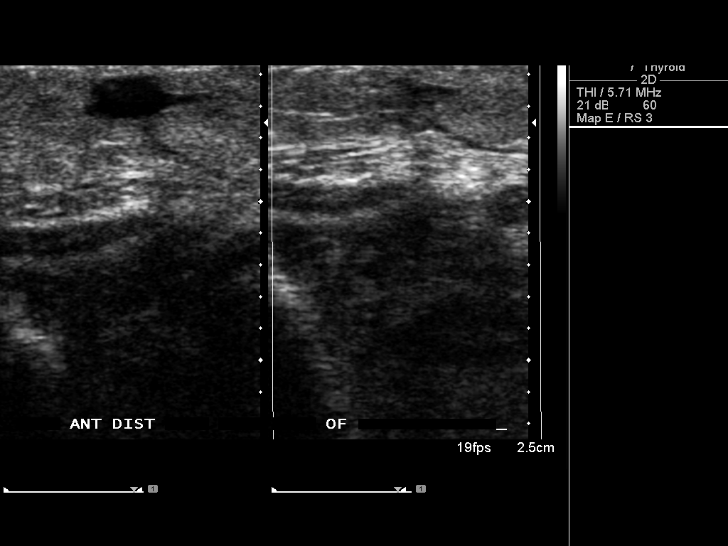
[im 3/33]
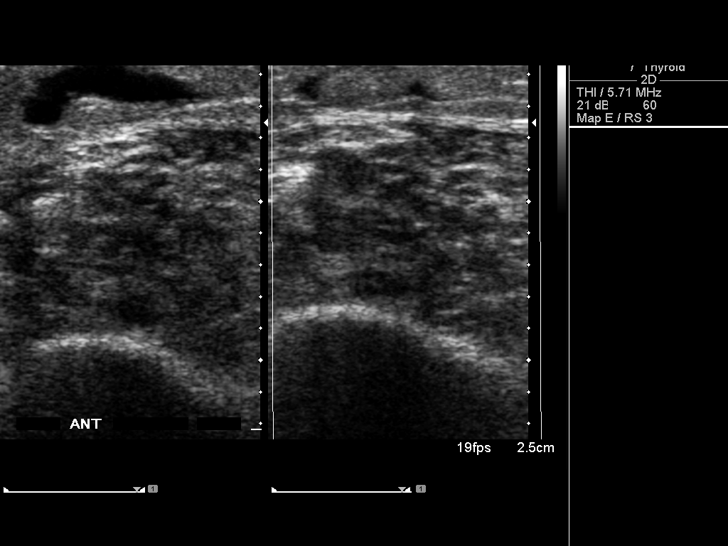
[im 7/33]
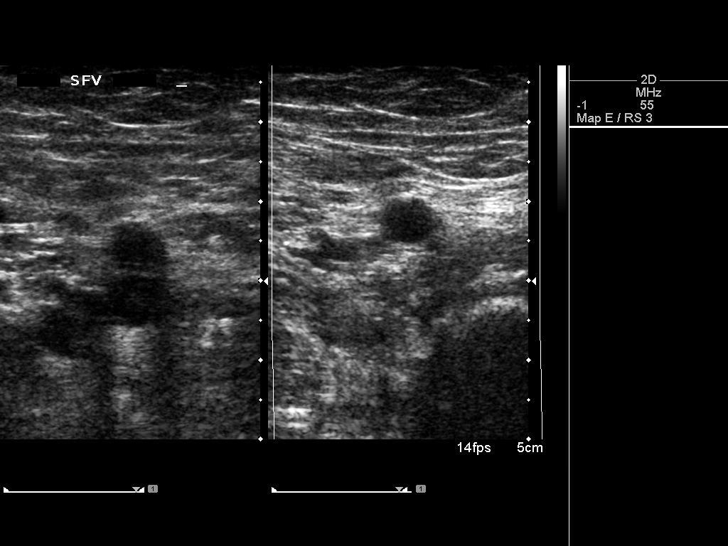
[im 9/33]
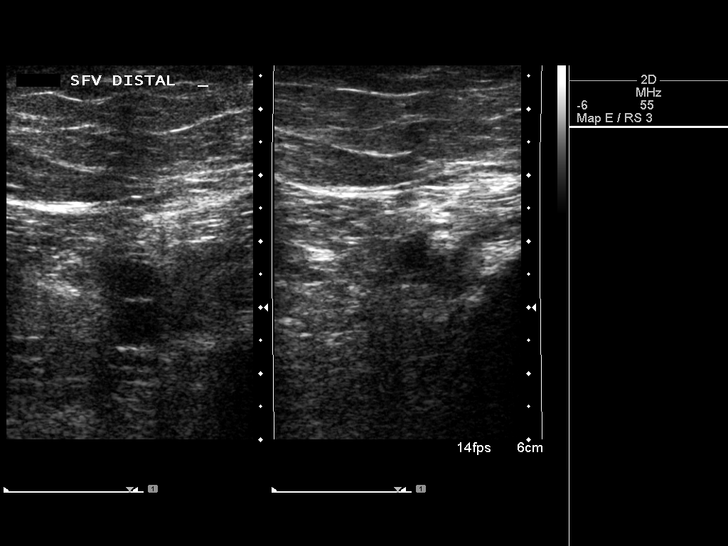
[im 11/33]
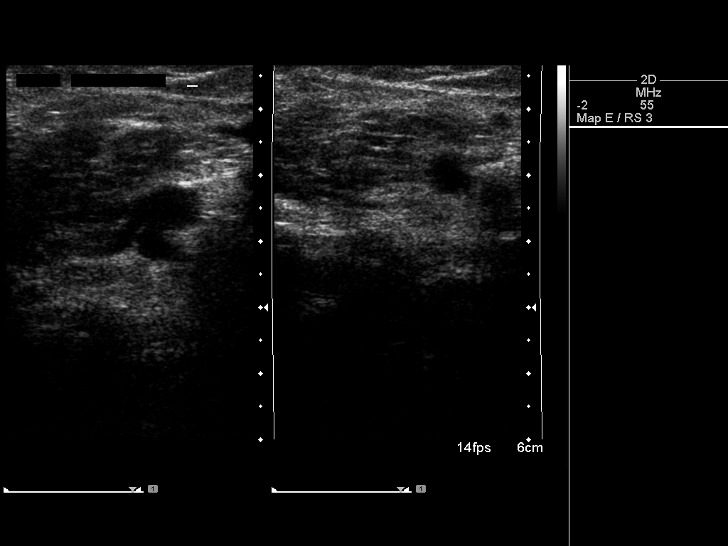
[im 13/33]
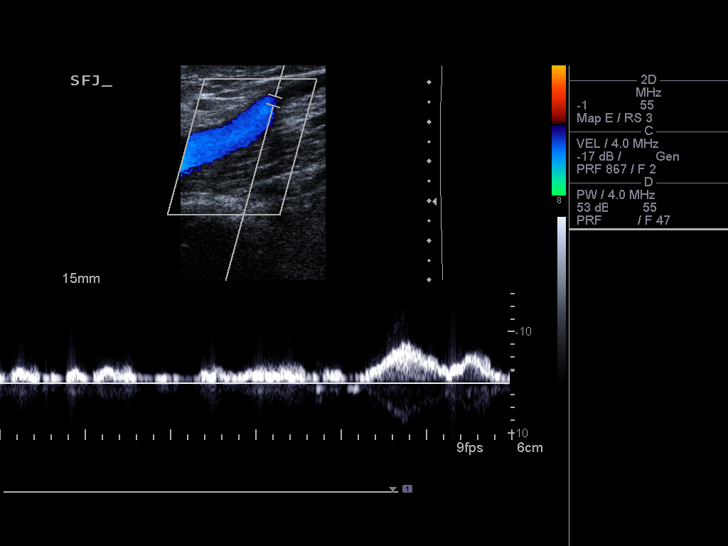
[im 18/33]
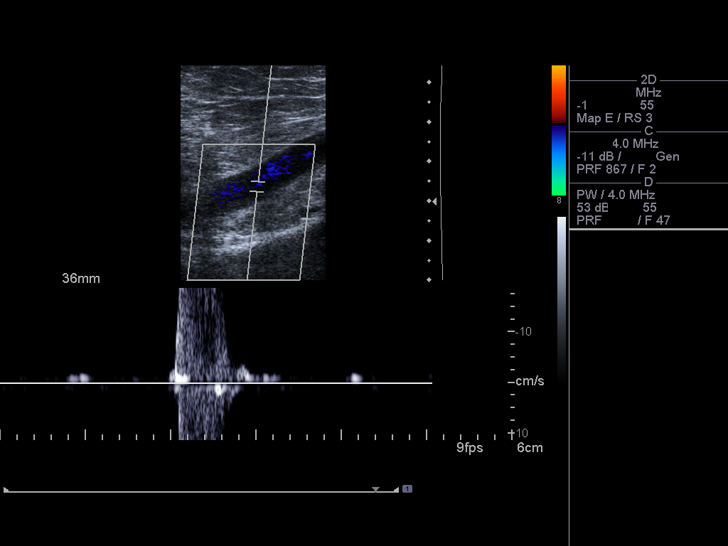
[im 20/33]
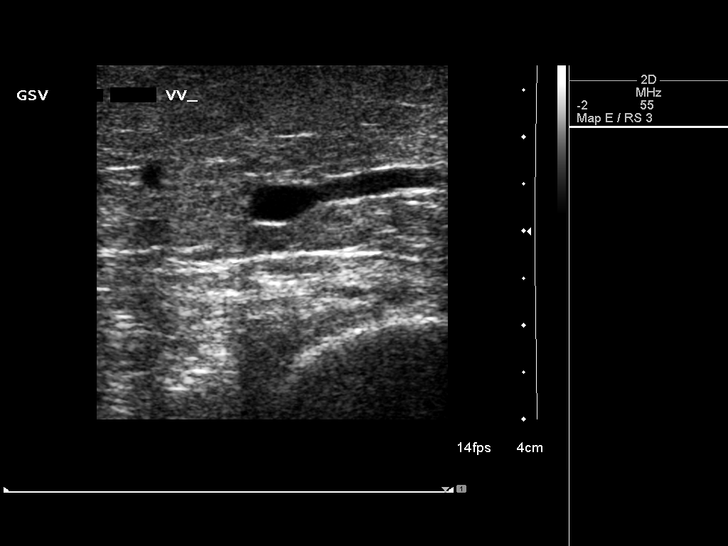
[im 22/33]
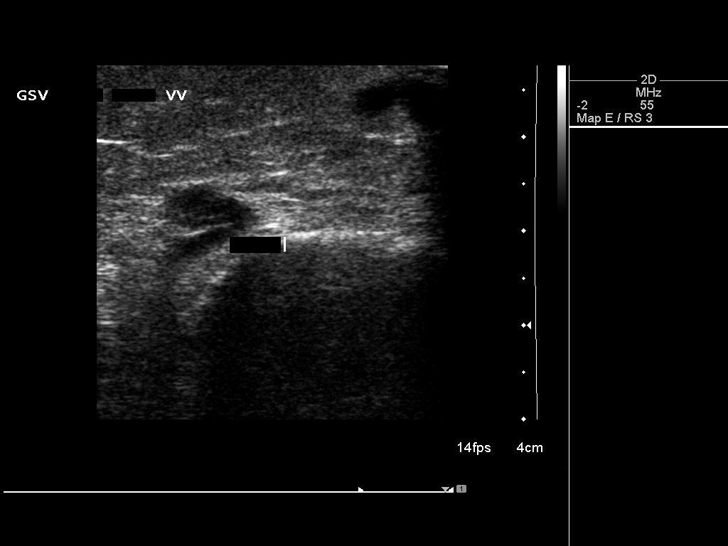
[im 24/33]
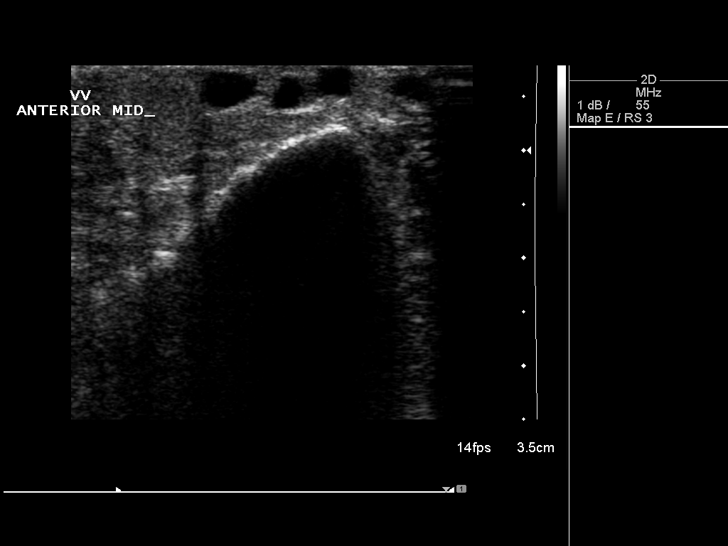
[im 26/33]
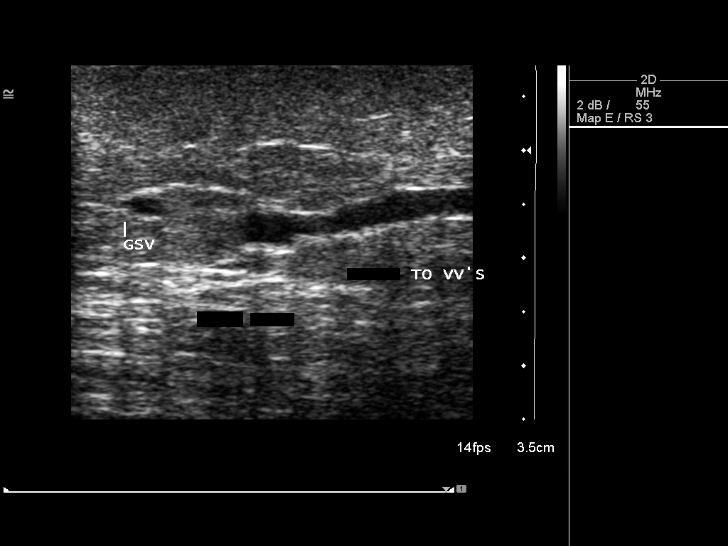
[im 30/33]
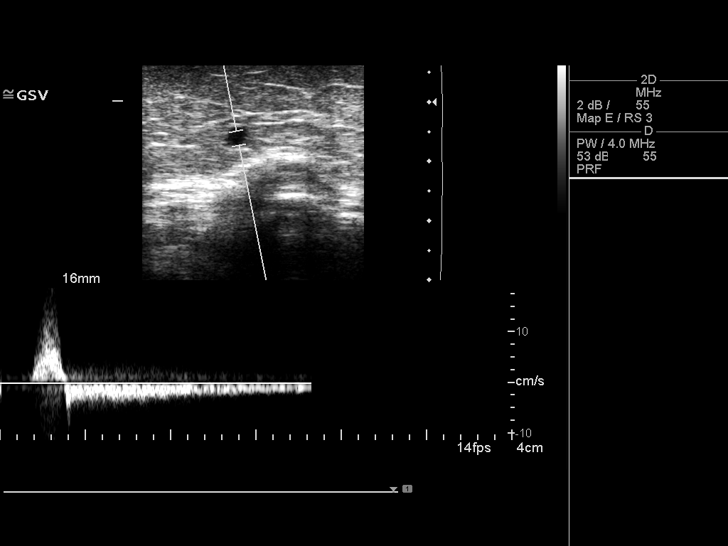
[im 33/33]
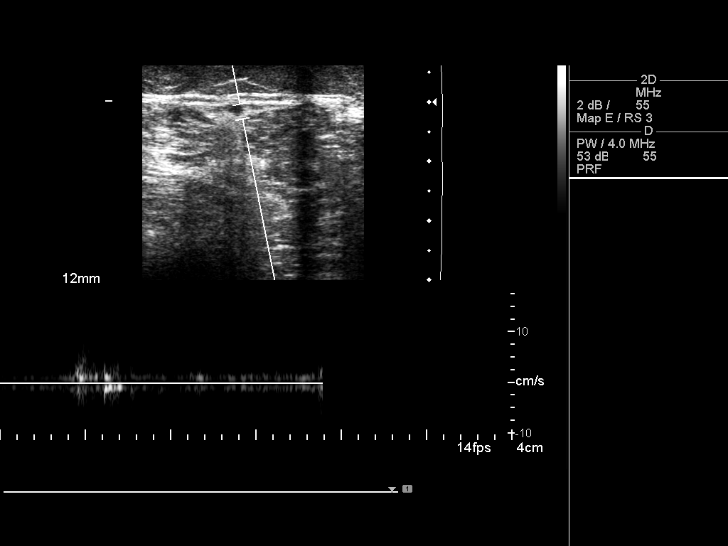

[13 of 16 positions shown; findings below may reference images not displayed]

Review of Systems:  Developing left lower extremity varicose veins,
leg pain and fatigue.  Remainder of the review of systems is
negative.

Exam:

Left lower extremity:  Small medial calf and tibial varicosities
noted.  Minor edema.  Scattered spider veins are evident.  No
thrombophlebitis or cellulitis.  Skin intact.  Normal pedal pulses.

Ultrasound:  This was dictated separately.  Briefly the left G S V
and saphenofemoral junction proximally are normal.  The left mid G
S V at the knee demonstrates a short segment of reflux resulting in
the newly developing calf varicosities.  These are the patient's
symptomatic varicosities.  These have slowly developed over several
months.  Left small saphenous vein is normal in caliber without
reflux.  No DVT demonstrated.

Today, I spent approximately 15 minutes with the patient and
reviewed the physical exam and ultrasound findings.  At this point
the developing left G S V venous insufficiency and varicosities are
minor.  Recommend conservative management.  If these progress or
enlarge they certainly could be treated with transcatheter laser
occlusion and  sclerotherapy.

Assessment and Plan:  Developing left lower extremity G S V venous
insufficiency at the knee with associated small calf varicosities.

Plan:  Initiate conservative management trial with a 20/30 mm or
compression stockings, daily walking, and avoiding precipitating
factors.  Follow-up 3 months.  It is in this progress considered
left G S V laser occlusion and varicose vein sclerotherapy.

## 2015-04-02 ENCOUNTER — Telehealth: Payer: Self-pay | Admitting: Oncology

## 2015-04-02 NOTE — Telephone Encounter (Signed)
returned call and s.w pt and r/s appt....pt ok and aware °

## 2015-04-10 ENCOUNTER — Other Ambulatory Visit (HOSPITAL_BASED_OUTPATIENT_CLINIC_OR_DEPARTMENT_OTHER): Payer: Medicare Other

## 2015-04-10 DIAGNOSIS — E859 Amyloidosis, unspecified: Secondary | ICD-10-CM

## 2015-04-10 LAB — CBC WITH DIFFERENTIAL/PLATELET
BASO%: 0.4 % (ref 0.0–2.0)
Basophils Absolute: 0 10*3/uL (ref 0.0–0.1)
EOS ABS: 0.1 10*3/uL (ref 0.0–0.5)
EOS%: 1.2 % (ref 0.0–7.0)
HCT: 44 % (ref 34.8–46.6)
HGB: 15.2 g/dL (ref 11.6–15.9)
LYMPH%: 35.4 % (ref 14.0–49.7)
MCH: 32.2 pg (ref 25.1–34.0)
MCHC: 34.6 g/dL (ref 31.5–36.0)
MCV: 93 fL (ref 79.5–101.0)
MONO#: 0.4 10*3/uL (ref 0.1–0.9)
MONO%: 8.8 % (ref 0.0–14.0)
NEUT#: 2.6 10*3/uL (ref 1.5–6.5)
NEUT%: 54.2 % (ref 38.4–76.8)
PLATELETS: 167 10*3/uL (ref 145–400)
RBC: 4.73 10*6/uL (ref 3.70–5.45)
RDW: 12.3 % (ref 11.2–14.5)
WBC: 4.8 10*3/uL (ref 3.9–10.3)
lymph#: 1.7 10*3/uL (ref 0.9–3.3)

## 2015-04-10 LAB — BASIC METABOLIC PANEL
ANION GAP: 9 meq/L (ref 3–11)
BUN: 14.4 mg/dL (ref 7.0–26.0)
CALCIUM: 9.3 mg/dL (ref 8.4–10.4)
CO2: 24 mEq/L (ref 22–29)
CREATININE: 0.9 mg/dL (ref 0.6–1.1)
Chloride: 105 mEq/L (ref 98–109)
EGFR: 67 mL/min/{1.73_m2} — ABNORMAL LOW (ref >90)
Glucose: 103 mg/dl (ref 70–140)
Potassium: 3.3 mEq/L — ABNORMAL LOW (ref 3.5–5.1)
Sodium: 138 mEq/L (ref 136–145)

## 2015-04-11 LAB — KAPPA/LAMBDA LIGHT CHAINS
IG KAPPA FREE LIGHT CHAIN: 67.64 mg/L — AB (ref 3.30–19.40)
Ig Lambda Free Light Chain: 33.18 mg/L — ABNORMAL HIGH (ref 5.71–26.30)
KAPPA/LAMBDA FLC RATIO: 2.04 — AB (ref 0.26–1.65)

## 2015-04-11 LAB — BETA 2 MICROGLOBULIN, SERUM: BETA 2: 3 mg/L — AB (ref 0.6–2.4)

## 2015-04-12 ENCOUNTER — Telehealth: Payer: Self-pay | Admitting: *Deleted

## 2015-04-12 NOTE — Telephone Encounter (Signed)
Pt notified that light chains are not changed significantly and that with our new lab the levels are reported approximately 10 times higher.  Dr. Benay Spice will explain this on your next visit.  Message left on patient's personal cell phone.

## 2015-04-12 NOTE — Telephone Encounter (Signed)
-----   Message from Ladell Pier, MD sent at 04/11/2015  8:38 PM EDT ----- Please call patient, light chains are not changed significantly, with new lab the levels are reported approx. 10 times higher Will explain next viist

## 2015-04-17 ENCOUNTER — Other Ambulatory Visit: Payer: Medicare Other

## 2015-04-24 ENCOUNTER — Telehealth: Payer: Self-pay | Admitting: Oncology

## 2015-04-24 ENCOUNTER — Encounter: Payer: Self-pay | Admitting: Oncology

## 2015-04-24 ENCOUNTER — Ambulatory Visit (HOSPITAL_BASED_OUTPATIENT_CLINIC_OR_DEPARTMENT_OTHER): Payer: Medicare Other | Admitting: Oncology

## 2015-04-24 VITALS — BP 117/60 | HR 70 | Temp 98.3°F | Resp 18 | Ht 62.5 in | Wt 151.8 lb

## 2015-04-24 DIAGNOSIS — E859 Amyloidosis, unspecified: Secondary | ICD-10-CM

## 2015-04-24 DIAGNOSIS — M810 Age-related osteoporosis without current pathological fracture: Secondary | ICD-10-CM

## 2015-04-24 NOTE — Telephone Encounter (Signed)
per pof to sch pt appt-gave pt copy of avs-sent MW email to sch trmt-pt to get updated copy of avs °

## 2015-04-24 NOTE — Telephone Encounter (Signed)
per pof to sch pt appt-gave pt copy of avs °

## 2015-04-24 NOTE — Progress Notes (Signed)
  [facility] OFFICE PROGRESS NOTE   Diagnosis:  amyloidosis  INTERVAL HISTORY:    Kelly Robles returns as scheduled. She reports having an upper respiratory infection approximately 3 weeks ago. She is now regaining her appetite and energy level. She injured the left foot in the bathroom when she was sick. She had a compression fracture treated by Dr. Mayer Camel. The back pain has improved. She developed left posterior lateral chest discomfort when she had the upper respiratory infection. This has improved.  No change in the skin lesions at the lower legs. No new lesions.  Objective:  Vital signs in last 24 hours:  Blood pressure 117/60, pulse 70, temperature 98.3 F (36.8 C), temperature source Oral, resp. rate 18, height 5' 2.5" (1.588 m), weight 151 lb 12.8 oz (68.856 kg), SpO2 100 %.    HEENT:  No thrush, the tongue appears normal Lymphatics:  No cervical, supraclavicular, or axillary nodes Resp:  Lungs clear bilaterally Cardio:  Regular rate and rhythm GI:  No hepatosplenomegaly Vascular:  No leg edema  musculoskeletal: no tenderness at the left posterior chest wall  Skin: 2 areas of brown discoloration at the left pretibial region. 1-2 centimeter area  Of raised purplish discoloration inferior to the right knee, 2 Brown healed/scarred lesions at the right pretibial area.     Lab Results:  Lab Results  Component Value Date   WBC 4.8 04/10/2015   HGB 15.2 04/10/2015   HCT 44.0 04/10/2015   MCV 93.0 04/10/2015   PLT 167 04/10/2015   NEUTROABS 2.6 04/10/2015   Beta-2 microglobulin 3.0  kappa free light chain 67.64   potassium 3.3, creatinine 0.9 , calcium 9.3  Medications: I have reviewed the patient's current medications.  Assessment/Plan: 1. Bilateral low leg cutaneous lesions, status post a biopsy of a left leg lesion on 01/03/2014 confirming amyloid with a plasma cell infiltrate   Mild elevation of the serum free kappa light chains   Mild elevation of  the beta-2 microglobulin   Bone marrow biopsy 02/20/2014 , 7 percent plasma cells (polyclonal) , negative Congo red stain 2. Discoid lupus  3. Sjogren's syndrome  4. Hepatitis B surface antibody positive July 2015-likely related to vaccination, hepatitis B surface antigen negative 02/03/2014  5. Osteoporosis    Disposition:   Kelly Robles appears stable. No clinical evidence for progression of the amyloidosis. The skin lesions appear unchanged. She continues follow-up with Dr. Ronnald Ramp to evaluate the skin lesions. She will return for an office and lab visit in 6 months.   Kelly Robles will contact us for recurrent back or chest wall pain.  Betsy Coder, MD  04/24/2015  11:16 AM

## 2015-10-10 ENCOUNTER — Encounter: Payer: Self-pay | Admitting: Physical Therapy

## 2015-10-10 ENCOUNTER — Ambulatory Visit (INDEPENDENT_AMBULATORY_CARE_PROVIDER_SITE_OTHER): Payer: Medicare Other | Admitting: Physical Therapy

## 2015-10-10 DIAGNOSIS — M6281 Muscle weakness (generalized): Secondary | ICD-10-CM | POA: Diagnosis not present

## 2015-10-10 DIAGNOSIS — R293 Abnormal posture: Secondary | ICD-10-CM

## 2015-10-10 DIAGNOSIS — M2560 Stiffness of unspecified joint, not elsewhere classified: Secondary | ICD-10-CM | POA: Diagnosis not present

## 2015-10-10 NOTE — Therapy (Signed)
[place] [facility], [place], [address] Phone: [phone]   Fax:  [phone]  Physical Therapy Evaluation  Patient Details  Name: Kelly Robles MRN: [id number] Date of Birth: [birth year] Referring Provider: Dr Velna Hatchet  Encounter Date: 10/10/2015      PT End of Session - 10/10/15 1413    Visit Number 1   Number of Visits 12   Date for PT Re-Evaluation 12/19/15   PT Start Time 1413   PT Stop Time 1533   PT Time Calculation (min) 80 min   Activity Tolerance Patient tolerated treatment well      Past Medical History:  Diagnosis Date  . Allergy   . COLONIC POLYPS, ADENOMATOUS 10/26/2003   Qualifier: Diagnosis of  By: Arnoldo Morale MD, [place] DISEASE 07/07/2006   Qualifier: Diagnosis of  By: Tiney Rouge CMA, Ellison Hughs    . DLE (discoid lupus erythematosus)   . GERD (gastroesophageal reflux disease)   . Hx of adenomatous polyp of colon 05/21/2009  . Hypertension   . Irritable bowel syndrome 07/07/2006   Qualifier: Diagnosis of  By: Tiney Rouge CMA, Ellison Hughs    . Osteoporosis   . Raynaud's disease   . 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 CHOLECYSTECTOMY    . COLONOSCOPY    . TONSILLECTOMY      There were no vitals filed for this visit.       Subjective Assessment - 10/10/15 1415    Subjective Pt is very interested in learning about care for herself with osteoporosis Currently doing some exercise. Has some Rt knee pain /issues at times, will catch at times with movement.    Diagnostic tests Sept 2016 T score spine -2.8, femoral neck Lt -3.1, Rt -2.4    Patient Stated Goals safe exercises to do and work on balance and anything  else she needs.    Currently in Pain? No/denies            [facility] PT Assessment - 10/10/15 0001      Assessment   Medical Diagnosis  Osteoporosis   Referring Provider Dr Velna Hatchet   Onset Date/Surgical Date 10/10/14   Hand Dominance Right   Next MD Visit PRN   Prior Therapy not for this     Precautions   Precautions Other (comment)   Precaution Comments osteoporosis - no forward flexion, limit rotation     Balance Screen   Has the patient fallen in the past 6 months Yes   How many times? 1  pt reports she passed out after her shower, had virus   Has the patient had a decrease in activity level because of a fear of falling?  No   Is the patient reluctant to leave their home because of a fear of falling?  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, occassionally lift flower arrangements   Leisure read, walking in nature, movies     Functional Tests   Functional tests Single leg stance     Single  Leg Stance   Comments Rt 14/5/14,  Lt 08/16/09     Posture/Postural Control   Posture/Postural Control --  REEDCO 55/100, 62.25 " tall,shoes off     ROM / Strength   AROM / PROM / Strength AROM;Strength     AROM   AROM Assessment Site Shoulder;Hip;Knee;Ankle   Right/Left Shoulder --  flex: Rt 160, Lt 164  ER: bilat 90-95 degrees   Right/Left Hip --  [id number], Lt 14 IR: Rt 20, Lt 29   Right/Left Knee --  flex:Rt 126, Lt 124   Right/Left Ankle --  dorsiflex, A/PROM: Rt 2/5, Lt 4/8     Strength   Overall Strength Comments bilat UE's WNL except IR/ER 4+/5,    Strength Assessment Site Knee;Hip;Ankle;Lumbar   Right/Left Hip Left;Right   Right Hip Flexion 5/5   Right Hip Extension 4+/5   Right Hip ABduction 4-/5   Left Hip Flexion 5/5   Left Hip Extension 4+/5   Left Hip ABduction 3+/5   Right/Left Knee Left  Rt WNL   Left Knee  Flexion --  5-/5   Left Knee Extension 4/5   Right/Left Ankle --  bilat WNL   Lumbar Extension --  multifidi poor     Flexibility   Soft Tissue Assessment /Muscle Length yes   Hamstrings supine SLR bilat ~ 90 degrees,    Quadriceps prone knee flexion Rt 104, Lt 109     Standardized Balance Assessment   Standardized Balance Assessment Timed Up and Go Test;Dynamic Gait Index     Dynamic Gait Index   Level Surface Mild Impairment   Change in Gait Speed Mild Impairment   Gait with Horizontal Head Turns Normal   Gait with Vertical Head Turns Mild Impairment   Gait and Pivot Turn Normal   Step Over Obstacle Mild Impairment   Step Around Obstacles Normal   Steps Mild Impairment   Total Score 19   DGI comment: score 19 or less, fall risk     Timed Up and Go Test   Normal TUG (seconds) 11  normal 12 sec or less)                    [facility] Adult PT Treatment/Exercise - 10/10/15 0001      Exercises   Exercises Knee/Hip;Lumbar     Lumbar Exercises: Standing   Other Standing Lumbar Exercises 20 reps scap retraction with shoulder ER leaning on noodle   Other Standing Lumbar Exercises MEEKs postural realignment routine withVC for form.      Knee/Hip Exercises: Stretches   Sports administrator Both;1 rep  45 sec prone with strap     Knee/Hip Exercises: Sidelying   Hip ABduction Strengthening;Both  to fatigue, VC for form                PT Education - 10/10/15 1620    Education provided Yes   Education Details HEP and initial education on osteoporosis   Person(s) Educated Patient   Methods Explanation;Demonstration;Handout   Comprehension Returned demonstration;Verbalized understanding          PT Short Term Goals - 10/10/15 1624      PT SHORT TERM GOAL #1   Title I with initial HEP ( 11/07/15)    Time 4   Period Weeks   Status New     PT SHORT TERM GOAL #2   Title verbalize precautions related to osteoporosis ( 11/07/15)    Time 4   Period Weeks  Status New     PT SHORT TERM GOAL #3   Title demo proper body mechanics with household chores ( 11/07/15)    Time 4   Period Weeks   Status New     PT SHORT TERM GOAL #4   Title improve DGI =/> 22/24 ( 11/07/15)    Time 4   Period Weeks   Status New           PT Long Term Goals - 10/10/15 1625      PT LONG TERM GOAL #1   Title I with advanced HEP ( 12/19/15)    Time 10   Period Weeks   Status New     PT LONG TERM GOAL #2   Title demo bilat hip strength =/> 5-/5 ( 12/19/15   Time 10   Period Weeks   Status New     PT LONG TERM GOAL #3   Title improve REEDCO posture score =/> 80/100 ( 12/19/15)    Time 10   Period Weeks   Status New     PT LONG TERM GOAL #4   Title increase bilat dorsiflexion =/> 18 degrees ( 12/19/15)    Time 10   Period Weeks   Status New     PT LONG TERM GOAL #5   Title demo strong contraction of multifidi and TA ( 12/19/15)    Time 10   Period Weeks   Status New               Plan - 10/10/15 1620    Clinical Impression Statement 70 yo very active female with diagnosis of osteoporosis this past year.  She presents with postural changes, some stiffness, weakness and balance defecits.  She is very interested in learning preventative exercises to help stop loss of posture, safe exercises and wishes to improve her balance.  She has one compression fracture and would like to prevent any more.    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    Consulted and Agree with Plan of Care Patient       Patient will benefit from skilled therapeutic intervention in order to improve the following deficits and impairments:  Postural dysfunction, Decreased strength, Improper body mechanics, Decreased range of motion  Visit Diagnosis: Abnormal posture - Plan: PT plan of care cert/re-cert  Muscle weakness (generalized) - Plan: PT plan of care cert/re-cert  Generalized stiffness - Plan: PT plan of care cert/re-cert     Problem List Patient Active Problem List   Diagnosis Date Noted  . 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 Hematoma 05/26/2014  . Hx of adenomatous polyp of colon 05/21/2009  . ANEMIA, B12 DEFICIENCY 08/26/2006  . LUPUS ERYTHEMATOSUS, DISCOID 08/26/2006  . OSTEOPOROSIS 08/26/2006  . Essential hypertension 07/07/2006  . ALLERGIC RHINITIS 07/07/2006  . GERD 07/07/2006    [name] PT  10/10/2015, 4:31 PM  [facility] [place], [place], [address] Phone: [phone]   Fax:  [phone]  Name: Kelly Robles MRN: [id number] Date of Birth: [birth year]

## 2015-10-10 NOTE — Patient Instructions (Addendum)
Resisted External Rotation: in Neutral - Bilateral    Stand, back against noodle on the wall, tubing in both hands, elbows at sides, bent to 90, forearms forward. Pinch shoulder blades together and rotate forearms out. Keep elbows at sides. Repeat _to fatigue___ times per set. Do __1__ sets per session. Do __1__ sessions per day. Use red or green band.   Strengthening: Hip Abduction (Side-Lying) - ( can do with back against a wall, slide leg up/down wall)     Tighten muscles on front of left thigh, then lift leg __8-12__ inches from surface, keeping knee locked.  Repeat __to fatigue__ times per set. Do __1__ sets per session. Do __1_ sessions per day. Repeat on the other side.   Quads / HF, Prone    Lie face down, knees together. Grasp one ankle with same-side hand. Use towel if needed to reach. Gently pull foot toward buttock. Hold __45_ seconds. Repeat _1__ times per session. Do __1_ sessions per day. Repeat on the other side  Decompression Exercise: Basic -   Lie on back on firm surface, knees bent, feet flat, arms turned up, out to sides, backs of hands down. Time _1-5__ minutes. Surface: floor  Shoulder Press   Press both shoulders down. Hold _3-5__ seconds. Repeat __10_ times. Press one shoulder down. Hold _3-5__ seconds Repeat _10__ times. Do other shoulder. If unable to press one or both shoulders, lie in position a few sessions until you can. Surface: floor. Once a day  Head Press With Lipscomb chin SLIGHTLY toward chest, keep mouth closed. Feel weight on back of head. Increase weight by pressing head down. Hold _3-5__ seconds. Relax. Repeat __10_ times. Surface: floor. Once a day  Leg Lengthener: Full   Straighten one leg. Pull toes AND forefoot toward knee, extend heel. Lengthen leg by pulling pelvis away from ribs. Hold _3-5__ seconds. Relax. Repeat 1 time. Re-bend knee. Do other leg. Each leg _10__ times. Surface: floor. Once a day  Leg Press:  Single   Straighten one leg down to floor. Bring toes AND forefoot toward knee, extend heel. Press leg down. DO NOT BEND KNEE. Hold _3-5__ seconds. Relax leg. Repeat exercise 10 times. Repeat with other leg. Once a day  [facility] [id number]  Copyright  VHI. All rights reserved.

## 2015-10-16 ENCOUNTER — Ambulatory Visit (INDEPENDENT_AMBULATORY_CARE_PROVIDER_SITE_OTHER): Payer: Medicare Other | Admitting: Physical Therapy

## 2015-10-16 DIAGNOSIS — M6281 Muscle weakness (generalized): Secondary | ICD-10-CM | POA: Diagnosis not present

## 2015-10-16 DIAGNOSIS — M2560 Stiffness of unspecified joint, not elsewhere classified: Secondary | ICD-10-CM | POA: Diagnosis not present

## 2015-10-16 DIAGNOSIS — R293 Abnormal posture: Secondary | ICD-10-CM

## 2015-10-16 NOTE — Therapy (Signed)
[place], [place], [address] Phone: [phone]   Fax:  [phone]  Physical Therapy Treatment  Patient Details  Name: Kelly Robles MRN: [id number] Date of Birth: 1945-06-27 Referring Provider: Dr Velna Hatchet  Encounter Date: 10/16/2015      PT End of Session - 10/16/15 1148    Visit Number 2   Number of Visits 12   Date for PT Re-Evaluation 12/19/15   PT Start Time 1149   PT Stop Time 1246   PT Time Calculation (min) 57 min   Activity Tolerance Patient tolerated treatment well      Past Medical History:  Diagnosis Date  . Allergy   . COLONIC POLYPS, ADENOMATOUS 10/26/2003   Qualifier: Diagnosis of  By: Arnoldo Morale MD, [place] DISEASE 07/07/2006   Qualifier: Diagnosis of  By: Tiney Rouge CMA, Ellison Hughs    . DLE (discoid lupus erythematosus)   . GERD (gastroesophageal reflux disease)   . Hx of adenomatous polyp of colon 05/21/2009  . Hypertension   . Irritable bowel syndrome 07/07/2006   Qualifier: Diagnosis of  By: Tiney Rouge CMA, Ellison Hughs    . Osteoporosis   . Raynaud's disease   . 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 CHOLECYSTECTOMY    . COLONOSCOPY    . TONSILLECTOMY      There were no vitals filed for this visit.      Subjective Assessment - 10/16/15 1153    Subjective Practiced some with her HEP, didn't do all the leg exercises.  Had a crazy week    Patient Stated Goals safe exercises to do and work on balance and anything  else she needs.    Currently in Pain? Yes   Pain Score 7    Pain Location Back   Pain Orientation Mid  thoracic issues.    Pain Type Acute pain   Aggravating Factors  long driving                         [facility] Adult PT Treatment/Exercise - 10/16/15 0001      Self-Care   Self-Care Other Self-Care  Comments   Other Self-Care Comments  reviewed and practiced body mechanics per hand out including golfers lift, hip hinging, standing tall      Therapeutic Activites    Therapeutic Activities Lifting;ADL's                  PT Short Term Goals - 10/16/15 1303      PT SHORT TERM GOAL #1   Title I with initial HEP ( 11/07/15)    Status On-going     PT SHORT TERM GOAL #2   Title verbalize precautions related to osteoporosis ( 11/07/15)    Status On-going     PT SHORT TERM GOAL #3   Title demo proper body mechanics with household chores ( 11/07/15)    Status On-going     PT SHORT TERM GOAL #4   Title improve DGI =/> 22/24 ( 11/07/15)    Status On-going           PT Long Term Goals - 10/16/15 1303      PT LONG TERM GOAL #1   Title I with advanced HEP ( 12/19/15)    Status On-going  PT LONG TERM GOAL #2   Title demo bilat hip strength =/> 5-/5 ( 12/19/15   Status On-going     PT LONG TERM GOAL #3   Title improve REEDCO posture score =/> 80/100 ( 12/19/15)    Status On-going     PT LONG TERM GOAL #4   Title increase bilat dorsiflexion =/> 18 degrees ( 12/19/15)    Status On-going     PT LONG TERM GOAL #5   Title demo strong contraction of multifidi and TA ( 12/19/15)    Status On-going               Plan - 10/16/15 1302    Clinical Impression Statement This is Porsha's second visit, the focus was on education, safe body mechanics and movement patterns.     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    Consulted and Agree with Plan of Care  Patient      Patient will benefit from skilled therapeutic intervention in order to improve the following deficits and impairments:  Postural dysfunction, Decreased strength, Improper body mechanics, Decreased range of motion  Visit Diagnosis: Abnormal posture  Muscle weakness (generalized)  Generalized stiffness     Problem List Patient Active Problem List   Diagnosis Date Noted  . 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 Hematoma 05/26/2014  . Hx of adenomatous polyp of colon 05/21/2009  . ANEMIA, B12 DEFICIENCY 08/26/2006  . LUPUS ERYTHEMATOSUS, DISCOID 08/26/2006  . OSTEOPOROSIS 08/26/2006  . Essential hypertension 07/07/2006  . ALLERGIC RHINITIS 07/07/2006  . GERD 07/07/2006    [name] PT  10/16/2015, 1:04 PM  [facility] [address] [place], [place], [address] Phone: [phone]   Fax:  [phone]  Name: Kelly Robles MRN: [id number] Date of Birth: September 19, 1945

## 2015-10-16 NOTE — Patient Instructions (Addendum)
Sleeping on Back  Place pillow under knees. A pillow with cervical support and a roll around waist are also helpful. Copyright  VHI. All rights reserved.  Sleeping on Side Place pillow between knees. Use cervical support under neck and a roll around waist as needed. Copyright  VHI. All rights reserved.   Sleeping on Stomach   If this is the only desirable sleeping position, place pillow under lower legs, and under stomach or chest as needed.  Posture - Sitting   Sit upright, head facing forward. Try using a roll to support lower back. Keep shoulders relaxed, and avoid rounded back. Keep hips level with knees. Avoid crossing legs for long periods. Stand to Sit / Sit to Stand   To sit: Bend knees to lower self onto front edge of chair, then scoot back on seat. To stand: Reverse sequence by placing one foot forward, and scoot to front of seat. Use rocking motion to stand up.   Work Height and Reach  Ideal work height is no more than 2 to 4 inches below elbow level when standing, and at elbow level when sitting. Reaching should be limited to arm's length, with elbows slightly bent.  Bending  Bend at hips and knees, not back. Keep feet shoulder-width apart.    Posture - Standing   Good posture is important. Avoid slouching and forward head thrust. Maintain curve in low back and align ears over shoul- ders, hips over ankles.  Alternating Positions   Alternate tasks and change positions frequently to reduce fatigue and muscle tension. Take rest breaks. Computer Work   Position work to face forward. Use proper work and seat height. Keep shoulders back and down, wrists straight, and elbows at right angles. Use chair that provides full back support. Add footrest and lumbar roll as needed.  Getting Into / Out of Car  Lower self onto seat, scoot back, then bring in one leg at a time. Reverse sequence to get out.  Dressing  Lie on back to pull socks or slacks over feet, or sit  and bend leg while keeping back straight.    Housework - Sink  Place one foot on ledge of cabinet under sink when standing at sink for prolonged periods.   Pushing / Pulling  Pushing is preferable to pulling. Keep back in proper alignment, and use leg muscles to do the work.  Deep Squat   Squat and lift with both arms held against upper trunk. Tighten stomach muscles without holding breath. Use smooth movements to avoid jerking.  Avoid Twisting   Avoid twisting or bending back. Pivot around using foot movements, and bend at knees if needed when reaching for articles.  Carrying Luggage   Distribute weight evenly on both sides. Use a cart whenever possible. Do not twist trunk. Move body as a unit.   Lifting Principles .Maintain proper posture and head alignment. .Slide object as close as possible before lifting. .Move obstacles out of the way. .Test before lifting; ask for help if too heavy. .Tighten stomach muscles without holding breath. .Use smooth movements; do not jerk. .Use legs to do the work, and pivot with feet. .Distribute the work load symmetrically and close to the center of trunk. .Push instead of pull whenever possible.   Ask For Help   Ask for help and delegate to others when possible. Coordinate your movements when lifting together, and maintain the low back curve.  Log Roll   Lying on back, bend left knee and place left   arm across chest. Roll all in one movement to the right. Reverse to roll to the left. Always move as one unit. Housework - Sweeping  Use long-handled equipment to avoid stooping.   Housework - Wiping  Position yourself as close as possible to reach work surface. Avoid straining your back.  Laundry - Unloading Wash   To unload small items at bottom of washer, lift leg opposite to arm being used to reach.  Crook close to area to be raked. Use arm movements to do the work. Keep back straight and avoid  twisting.     Cart  When reaching into cart with one arm, lift opposite leg to keep back straight.   Getting Into / Out of Bed  Lower self to lie down on one side by raising legs and lowering head at the same time. Use arms to assist moving without twisting. Bend both knees to roll onto back if desired. To sit up, start from lying on side, and use same move-ments in reverse. Housework - Vacuuming  Hold the vacuum with arm held at side. Step back and forth to move it, keeping head up. Avoid twisting.   Laundry - IT consultant so that bending and twisting can be avoided.   Laundry - Unloading Dryer  Squat down to reach into clothes dryer or use a reacher.  Gardening - Weeding / Probation officer or Kneel. Knee pads may be helpful.                  POSTURAL CORRECTION Tips A  GOOD POSTURE (better body alignment) will: -distribute weight bearing forces throughout the skeleton more correctly, thereby helping to: -stimulate the growth and maintenance of stronger, healthier, denser bones, and -reduce the risk of fracture, other injury or back pain.   Copyright  VHI. All rights reserved.  POSTURAL CORRECTION Tips B  THINK of your body as a whole unit, extending from the bottom of your feet to the top of your head. All parts of the body are inter-connected and need to be in good anatomical alignment for the better health and functioning of the whole.   Copyright  VHI. All rights reserved.  POSTURAL CORRECTION Tips F  PRACTICE: -anytime, anywhere - e.g., standing in line at the supermarket, after finishing brushing your teeth or combing your hair, while talking on the telephone, waiting at the bus / trolley stop, etc. -elements of postural correction while sitting working at your computer, reading, driving, or other seated activity. -good body alignment when lying down. NOTICE as you practice, your old, bad habits are replaced by new ones that  become more natural.  Copyright  VHI. All rights reserved.

## 2015-10-23 ENCOUNTER — Ambulatory Visit (INDEPENDENT_AMBULATORY_CARE_PROVIDER_SITE_OTHER): Payer: Medicare Other | Admitting: Physical Therapy

## 2015-10-23 ENCOUNTER — Encounter: Payer: Self-pay | Admitting: Physical Therapy

## 2015-10-23 DIAGNOSIS — M2560 Stiffness of unspecified joint, not elsewhere classified: Secondary | ICD-10-CM | POA: Diagnosis not present

## 2015-10-23 DIAGNOSIS — M6281 Muscle weakness (generalized): Secondary | ICD-10-CM | POA: Diagnosis not present

## 2015-10-23 DIAGNOSIS — R293 Abnormal posture: Secondary | ICD-10-CM | POA: Diagnosis not present

## 2015-10-23 NOTE — Patient Instructions (Signed)
Over Head Pull: Narrow Grip     [place] [id number], start on the floor, progress to lying on a towel roll and then foam noodle.    On back, knees bent, feet flat, band across thighs, elbows straight but relaxed. Pull hands apart (start). Keeping elbows straight, bring arms up and over head, hands toward floor. Keep pull steady on band. Hold momentarily. Return slowly, keeping pull steady, back to start. Repeat _10__ times. Band color __dark green ____   Side Pull: Double Arm   On back, knees bent, feet flat. Arms perpendicular to body, shoulder level, elbows straight but relaxed. Pull arms out to sides, elbows straight. Resistance band comes across collarbones, hands toward floor. Hold momentarily. Slowly return to starting position. Repeat __10_ times. Band color __dark green___   Elmer Picker   On back, knees bent, feet flat, left hand on left hip, right hand above left. Pull right arm DIAGONALLY (hip to shoulder) across chest. Bring right arm along head toward floor. Hold momentarily. Slowly return to starting position. Repeat __10_ times. Do with left arm. Band color __dark green____   Shoulder Rotation: Double Arm   On back, knees bent, feet flat, elbows tucked at sides, bent 90, hands palms up. Pull hands apart and down toward floor, keeping elbows near sides. Hold momentarily. Slowly return to starting position. Repeat _10__ times. Band color __dark green____

## 2015-10-23 NOTE — Therapy (Signed)
[place], [place], [address] Phone: [phone]   Fax:  [phone]  Physical Therapy Treatment  Patient Details  Name: Kelly Robles MRN: [id number] Date of Birth: 1945/07/27 Referring Provider: Dr Velna Hatchet  Encounter Date: 10/23/2015      PT End of Session - 10/23/15 1150    Visit Number 3   Number of Visits 12   Date for PT Re-Evaluation 12/19/15   PT Start Time 1150   PT Stop Time 1244   PT Time Calculation (min) 54 min   Activity Tolerance Patient tolerated treatment well      Past Medical History:  Diagnosis Date  . Allergy   . COLONIC POLYPS, ADENOMATOUS 10/26/2003   Qualifier: Diagnosis of  By: Arnoldo Morale MD, [place] DISEASE 07/07/2006   Qualifier: Diagnosis of  By: Tiney Rouge CMA, Ellison Hughs    . DLE (discoid lupus erythematosus)   . GERD (gastroesophageal reflux disease)   . Hx of adenomatous polyp of colon 05/21/2009  . Hypertension   . Irritable bowel syndrome 07/07/2006   Qualifier: Diagnosis of  By: Tiney Rouge CMA, Ellison Hughs    . Osteoporosis   . Raynaud's disease   . 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 CHOLECYSTECTOMY    . COLONOSCOPY    . TONSILLECTOMY      There were no vitals filed for this visit.      Subjective Assessment - 10/23/15 1152    Subjective Pt reports she was feeling very overwhelmed with all the things she needs to change.  Hip hinging are the most diffcult. Wishes to review the decompression series.    Patient Stated Goals safe exercises to do and work on balance and anything  else she needs.    Currently in Pain? No/denies                         [facility] Adult PT Treatment/Exercise - 10/23/15 0001      Self-Care   Self-Care Other Self-Care Comments   Other Self-Care Comments  reviewed body  mechanic questions she had, and looked at her bands.      Lumbar Exercises: Supine   Other Supine Lumbar Exercises reviewed post realignment, over pressure to Rt shoulder with presses,    Other Supine Lumbar Exercises supine posture realignment with her green ( easy ) band, 10 each  per HEP handout on mat and on noodle                  PT Short Term Goals - 10/23/15 1217      PT SHORT TERM GOAL #1   Title I with initial HEP ( 11/07/15)    Status Achieved     PT SHORT TERM GOAL #2   Title verbalize precautions related to osteoporosis ( 11/07/15)    Status Achieved     PT SHORT TERM GOAL #3   Title demo proper body mechanics with household chores ( 11/07/15)    Status On-going     PT SHORT TERM GOAL #4   Title improve DGI =/> 22/24 ( 11/07/15)    Status On-going           PT Long Term Goals - 10/23/15 1218      PT LONG TERM GOAL #1   Title  I with advanced HEP ( 12/19/15)    Status On-going     PT LONG TERM GOAL #2   Title demo bilat hip strength =/> 5-/5 ( 12/19/15   Status On-going     PT LONG TERM GOAL #3   Title improve REEDCO posture score =/> 80/100 ( 12/19/15)    Status On-going     PT LONG TERM GOAL #4   Title increase bilat dorsiflexion =/> 18 degrees ( 12/19/15)    Status On-going     PT LONG TERM GOAL #5   Title demo strong contraction of multifidi and TA ( 12/19/15)    Status On-going               Plan - 10/23/15 1255    Clinical Impression Statement Avia is doing well with initial HEP, she has met two of her STGs and progressing to the others.  She is feeling a little overwhelmed with all the information and we discussed decreasing frequency to once a week and spreading it out after this week.  Pt is sitting and standing more upright   Rehab Potential Excellent   PT Frequency 2x / week   PT Duration 8 weeks   PT Treatment/Interventions Moist Heat;Ultrasound;Therapeutic exercise;Taping;Dry needling;Neuromuscular  re-education;Cryotherapy;Electrical Stimulation;Stair training;Gait training;Patient/family education   PT Next Visit Plan cont with back of the body strengthening, review upper and add in a couple more.    Consulted and Agree with Plan of Care Patient      Patient will benefit from skilled therapeutic intervention in order to improve the following deficits and impairments:  Postural dysfunction, Decreased strength, Improper body mechanics, Decreased range of motion  Visit Diagnosis: Abnormal posture  Muscle weakness (generalized)  Generalized stiffness     Problem List Patient Active Problem List   Diagnosis Date Noted  . 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 Hematoma 05/26/2014  . Hx of adenomatous polyp of colon 05/21/2009  . ANEMIA, B12 DEFICIENCY 08/26/2006  . LUPUS ERYTHEMATOSUS, DISCOID 08/26/2006  . OSTEOPOROSIS 08/26/2006  . Essential hypertension 07/07/2006  . ALLERGIC RHINITIS 07/07/2006  . GERD 07/07/2006    [name] PT  10/23/2015, 12:57 PM  [facility] [place], [place], [address] Phone: [phone]   Fax:  [phone]  Name: Kelly Robles MRN: [id number] Date of Birth: Jan 07, 1946

## 2015-10-24 ENCOUNTER — Other Ambulatory Visit (HOSPITAL_BASED_OUTPATIENT_CLINIC_OR_DEPARTMENT_OTHER): Payer: Medicare Other

## 2015-10-24 DIAGNOSIS — E859 Amyloidosis, unspecified: Secondary | ICD-10-CM

## 2015-10-24 LAB — CBC WITH DIFFERENTIAL/PLATELET
BASO%: 0.4 % (ref 0.0–2.0)
BASOS ABS: 0 10*3/uL (ref 0.0–0.1)
EOS ABS: 0.2 10*3/uL (ref 0.0–0.5)
EOS%: 2.6 % (ref 0.0–7.0)
HCT: 38.5 % (ref 34.8–46.6)
HGB: 13.4 g/dL (ref 11.6–15.9)
LYMPH%: 27.9 % (ref 14.0–49.7)
MCH: 32.5 pg (ref 25.1–34.0)
MCHC: 34.8 g/dL (ref 31.5–36.0)
MCV: 93.4 fL (ref 79.5–101.0)
MONO#: 0.4 10*3/uL (ref 0.1–0.9)
MONO%: 6.5 % (ref 0.0–14.0)
NEUT#: 3.6 10*3/uL (ref 1.5–6.5)
NEUT%: 62.6 % (ref 38.4–76.8)
Platelets: 197 10*3/uL (ref 145–400)
RBC: 4.12 10*6/uL (ref 3.70–5.45)
RDW: 12.7 % (ref 11.2–14.5)
WBC: 5.7 10*3/uL (ref 3.9–10.3)
lymph#: 1.6 10*3/uL (ref 0.9–3.3)

## 2015-10-24 LAB — BASIC METABOLIC PANEL
ANION GAP: 9 meq/L (ref 3–11)
BUN: 14.2 mg/dL (ref 7.0–26.0)
CALCIUM: 9.5 mg/dL (ref 8.4–10.4)
CO2: 23 mEq/L (ref 22–29)
CREATININE: 0.8 mg/dL (ref 0.6–1.1)
Chloride: 109 mEq/L (ref 98–109)
EGFR: 78 mL/min/{1.73_m2} — AB (ref >90)
GLUCOSE: 86 mg/dL (ref 70–140)
Potassium: 4.1 mEq/L (ref 3.5–5.1)
SODIUM: 141 meq/L (ref 136–145)

## 2015-10-25 LAB — KAPPA/LAMBDA LIGHT CHAINS
Ig Kappa Free Light Chain: 28.5 mg/L — ABNORMAL HIGH (ref 3.3–19.4)
Ig Lambda Free Light Chain: 19.8 mg/L (ref 5.7–26.3)
Kappa/Lambda FluidC Ratio: 1.44 (ref 0.26–1.65)

## 2015-10-25 LAB — BETA 2 MICROGLOBULIN, SERUM: Beta-2: 2.3 mg/L (ref 0.6–2.4)

## 2015-10-26 ENCOUNTER — Encounter: Payer: Self-pay | Admitting: Physical Therapy

## 2015-10-26 ENCOUNTER — Ambulatory Visit (INDEPENDENT_AMBULATORY_CARE_PROVIDER_SITE_OTHER): Payer: Medicare Other | Admitting: Physical Therapy

## 2015-10-26 DIAGNOSIS — M6281 Muscle weakness (generalized): Secondary | ICD-10-CM

## 2015-10-26 DIAGNOSIS — M2560 Stiffness of unspecified joint, not elsewhere classified: Secondary | ICD-10-CM | POA: Diagnosis not present

## 2015-10-26 DIAGNOSIS — R293 Abnormal posture: Secondary | ICD-10-CM | POA: Diagnosis not present

## 2015-10-26 NOTE — Patient Instructions (Addendum)
Thoracic Lift    Press shoulders down. Then lift mid-thoracic spine (area between the shoulder blades). Lift the breastbone slightly. Hold _3-5__ seconds. Relax. Repeat _as needed or 5-10 reps__ times.   Elbow Press - if numbness or tingling come into your hands rest the arms down.     Interlace fingers; bring hands underneath head. Press elbows down. Hold _3-5__ seconds. Relax arms. Repeat as needed or 5-10___ times.   Thoracic Self-Mobilization Stretch (Supine)    With small rolled towel at lower ribs level, gently lie back until stretch is felt. Hold __20-30__ seconds. Relax. Build up time Repeat _1___ times per set. Do _1___ sets per session. Do __as needed__ sessions per day.  Shoulder Blade Squeeze: Fingers Interlaced    Fingers interlaced behind your body, palms facing each other. Press pelvis down. Squeeze backbone with shoulder blades. Raise front of shoulders, chest, and head. Keep neck neutral. Hold _1-2_ seconds. Relax. Repeat __5-10_ times.  Shoulder Blade Squeeze: Airplane    Arms out to sides at 90, elbows straight, palms down. Press pelvis down. Squeeze backbone with shoulder blades. Raise arms, front of shoulders, chest, and head. Keep neck neutral. Hold _1-2__ seconds. Relax. Repeat _5-10__ times.  Shoulder Blade Squeeze: W    Arms out to sides at 90 palms down. Bend elbows to 90. Press pelvis down. Squeeze backbone with shoulder blades. Raise arms, front of shoulders, chest, and head. Keep neck neutral. Hold _1-2__ seconds. Relax. Repeat _5-10__ times. Copyright  [employer]. All rights reserved.

## 2015-10-26 NOTE — Therapy (Signed)
Kelly Robles, [place], [address] Phone: [phone]   Fax:  [phone]  Physical Therapy Treatment  Patient Details  Name: Kelly Robles MRN: [id number] Date of Birth: 1945-05-29 Referring Provider: Dr Velna Hatchet  Encounter Date: 10/26/2015      PT End of Session - 10/26/15 1403    Visit Number 4   Number of Visits 12   Date for PT Re-Evaluation 12/19/15   PT Start Time [id number]   PT Stop Time 1442   PT Time Calculation (min) 39 min      Past Medical History:  Diagnosis Date  . Allergy   . COLONIC POLYPS, ADENOMATOUS 10/26/2003   Qualifier: Diagnosis of  By: Arnoldo Morale MD, [place] DISEASE 07/07/2006   Qualifier: Diagnosis of  By: Tiney Rouge CMA, Ellison Hughs    . DLE (discoid lupus erythematosus)   . GERD (gastroesophageal reflux disease)   . Hx of adenomatous polyp of colon 05/21/2009  . Hypertension   . Irritable bowel syndrome 07/07/2006   Qualifier: Diagnosis of  By: Tiney Rouge CMA, Ellison Hughs    . Osteoporosis   . Raynaud's disease   . 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 CHOLECYSTECTOMY    . COLONOSCOPY    . TONSILLECTOMY      There were no vitals filed for this visit.      Subjective Assessment - 10/26/15 1404    Subjective Pt reports her neck feels a little stiff , not sure why.  Rt knee is a little tender.  Has been driving a lot.    Patient Stated Goals safe exercises to do and work on balance and anything  else she needs.    Currently in Pain? No/denies                         [facility] Adult PT Treatment/Exercise - 10/26/15 0001      Lumbar Exercises: Standing   Other Standing Lumbar Exercises 3 way doorway stretch for pecs     Lumbar Exercises: Supine   Other Supine Lumbar Exercises reviewed post realignment, over pressure    Other Supine Lumbar Exercises thoracic lifts and elbow presses per handout.   thoracic self mob over noodle.                  PT Short Term Goals - 10/23/15 1217      PT SHORT TERM GOAL #1   Title I with initial HEP ( 11/07/15)    Status Achieved     PT SHORT TERM GOAL #2   Title verbalize precautions related to osteoporosis ( 11/07/15)    Status Achieved     PT SHORT TERM GOAL #3   Title demo proper body mechanics with household chores ( 11/07/15)    Status On-going     PT SHORT TERM GOAL #4   Title improve DGI =/> 22/24 ( 11/07/15)    Status On-going           PT Long Term Goals - 10/23/15 1218      PT LONG TERM GOAL #1   Title I with advanced HEP ( 12/19/15)    Status On-going     PT LONG TERM GOAL #2   Title demo bilat hip strength =/> 5-/5 ( 12/19/15   Status  On-going     PT LONG TERM GOAL #3   Title improve REEDCO posture score =/> 80/100 ( 12/19/15)    Status On-going     PT LONG TERM GOAL #4   Title increase bilat dorsiflexion =/> 18 degrees ( 12/19/15)    Status On-going     PT LONG TERM GOAL #5   Title demo strong contraction of multifidi and TA ( 12/19/15)    Status On-going               Plan - 10/26/15 1436    Clinical Impression Statement Baelyn is doing well with her HEP the initial and the ones with the bands. She needed minimal VC for form today.    Rehab Potential Excellent   PT Frequency 2x / week   PT Duration 8 weeks   PT Treatment/Interventions Moist Heat;Ultrasound;Therapeutic exercise;Taping;Dry needling;Neuromuscular re-education;Cryotherapy;Electrical Stimulation;Stair training;Gait training;Patient/family education   PT Next Visit Plan begin pelvic press series and walking/open chain education    Consulted and Agree with Plan of Care Patient      Patient will benefit from skilled therapeutic intervention in order to improve the following deficits and impairments:  Postural dysfunction, Decreased strength,  Improper body mechanics, Decreased range of motion  Visit Diagnosis: Abnormal posture  Muscle weakness (generalized)  Generalized stiffness     Problem List Patient Active Problem List   Diagnosis Date Noted  . 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 Hematoma 05/26/2014  . Hx of adenomatous polyp of colon 05/21/2009  . ANEMIA, B12 DEFICIENCY 08/26/2006  . LUPUS ERYTHEMATOSUS, DISCOID 08/26/2006  . OSTEOPOROSIS 08/26/2006  . Essential hypertension 07/07/2006  . ALLERGIC RHINITIS 07/07/2006  . GERD 07/07/2006    [name] PT  10/26/2015, 2:44 PM  [facility] [place], [place], [address] Phone: [phone]   Fax:  [phone]  Name: Kelly Robles MRN: [id number] Date of Birth: August 23, 1945

## 2015-10-29 ENCOUNTER — Encounter: Payer: Medicare Other | Admitting: Physical Therapy

## 2015-11-01 ENCOUNTER — Ambulatory Visit (INDEPENDENT_AMBULATORY_CARE_PROVIDER_SITE_OTHER): Payer: Medicare Other | Admitting: Physical Therapy

## 2015-11-01 ENCOUNTER — Ambulatory Visit (HOSPITAL_BASED_OUTPATIENT_CLINIC_OR_DEPARTMENT_OTHER): Payer: Medicare Other | Admitting: Oncology

## 2015-11-01 ENCOUNTER — Encounter: Payer: Self-pay | Admitting: Physical Therapy

## 2015-11-01 VITALS — BP 130/67 | HR 65 | Temp 98.1°F | Resp 18 | Ht 62.5 in | Wt 154.9 lb

## 2015-11-01 DIAGNOSIS — E859 Amyloidosis, unspecified: Secondary | ICD-10-CM

## 2015-11-01 DIAGNOSIS — M3500 Sicca syndrome, unspecified: Secondary | ICD-10-CM

## 2015-11-01 DIAGNOSIS — M2560 Stiffness of unspecified joint, not elsewhere classified: Secondary | ICD-10-CM

## 2015-11-01 DIAGNOSIS — L930 Discoid lupus erythematosus: Secondary | ICD-10-CM

## 2015-11-01 DIAGNOSIS — M810 Age-related osteoporosis without current pathological fracture: Secondary | ICD-10-CM | POA: Diagnosis not present

## 2015-11-01 DIAGNOSIS — R293 Abnormal posture: Secondary | ICD-10-CM | POA: Diagnosis not present

## 2015-11-01 DIAGNOSIS — M6281 Muscle weakness (generalized): Secondary | ICD-10-CM | POA: Diagnosis not present

## 2015-11-01 NOTE — Therapy (Signed)
[place], [place], [address] Phone: [phone]   Fax:  [phone]  Physical Therapy Treatment  Patient Details  Name: Kelly Robles MRN: [id number] Date of Birth: July 06, 1945 Referring Provider: Dr Velna Hatchet  Encounter Date: 11/01/2015      PT End of Session - 11/01/15 1450    Visit Number 5   Number of Visits 12   Date for PT Re-Evaluation 12/19/15   PT Start Time 1450   PT Stop Time 1532   PT Time Calculation (min) 42 min      Past Medical History:  Diagnosis Date  . Allergy   . COLONIC POLYPS, ADENOMATOUS 10/26/2003   Qualifier: Diagnosis of  By: Arnoldo Morale MD, [place] DISEASE 07/07/2006   Qualifier: Diagnosis of  By: Tiney Rouge CMA, Ellison Hughs    . DLE (discoid lupus erythematosus)   . GERD (gastroesophageal reflux disease)   . Hx of adenomatous polyp of colon 05/21/2009  . Hypertension   . Irritable bowel syndrome 07/07/2006   Qualifier: Diagnosis of  By: Tiney Rouge CMA, Ellison Hughs    . Osteoporosis   . Raynaud's disease   . 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 CHOLECYSTECTOMY    . COLONOSCOPY    . TONSILLECTOMY      There were no vitals filed for this visit.      Subjective Assessment - 11/01/15 1450    Subjective exercises are doing well., likes the idea of using the towel to open the chest.                          [facility] Adult PT Treatment/Exercise - 11/01/15 0001      Self-Care   Other Self-Care Comments  reviewed open/closed chain exercise, walking in all directions for bone growth and balance    10 reps pelvic press routine, upper and lower body followed up knees to chest to stretch out.  VC for form.                 PT Education - 11/01/15 1512    Education provided Yes   Education Details pelvic press  series, open/closed chain activities   Person(s) Educated Patient   Methods Explanation;Demonstration;Handout   Comprehension Returned demonstration;Verbalized understanding          PT Short Term Goals - 10/23/15 1217      PT SHORT TERM GOAL #1   Title I with initial HEP ( 11/07/15)    Status Achieved     PT SHORT TERM GOAL #2   Title verbalize precautions related to osteoporosis ( 11/07/15)    Status Achieved     PT SHORT TERM GOAL #3   Title demo proper body mechanics with household chores ( 11/07/15)    Status On-going     PT SHORT TERM GOAL #4   Title improve DGI =/> 22/24 ( 11/07/15)    Status On-going           PT Long Term Goals - 11/01/15 1528      PT LONG TERM GOAL #1   Title I with advanced HEP ( 12/19/15)    Status On-going     PT LONG TERM GOAL #2   Title demo bilat hip strength =/> 5-/5 ( 12/19/15   Status On-going  PT LONG TERM GOAL #3   Title improve REEDCO posture score =/> 80/100 ( 12/19/15)    Status On-going     PT LONG TERM GOAL #4   Title increase bilat dorsiflexion =/> 18 degrees ( 12/19/15)    Status On-going     PT LONG TERM GOAL #5   Title demo strong contraction of multifidi and TA ( 12/19/15)    Status Achieved               Plan - 11/01/15 1527    Clinical Impression Statement Kelly Robles continues to do an excellent job embracing her HEP and learning the proper form and maintaining it during her exercise. She comes in with great questions.  Progressing to goals, has met one of them   Rehab Potential Excellent   PT Frequency 2x / week   PT Duration 8 weeks   PT Treatment/Interventions Moist Heat;Ultrasound;Therapeutic exercise;Taping;Dry needling;Neuromuscular re-education;Cryotherapy;Electrical Stimulation;Stair training;Gait training;Patient/family education   PT Next Visit Plan review current HEP and continue progress through program  Check hip strength ,and ankle motion   Consulted and Agree with Plan of Care Patient       Patient will benefit from skilled therapeutic intervention in order to improve the following deficits and impairments:  Postural dysfunction, Decreased strength, Improper body mechanics, Decreased range of motion  Visit Diagnosis: Abnormal posture  Muscle weakness (generalized)  Generalized stiffness     Problem List Patient Active Problem List   Diagnosis Date Noted  . 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 Hematoma 05/26/2014  . Hx of adenomatous polyp of colon 05/21/2009  . ANEMIA, B12 DEFICIENCY 08/26/2006  . LUPUS ERYTHEMATOSUS, DISCOID 08/26/2006  . OSTEOPOROSIS 08/26/2006  . Essential hypertension 07/07/2006  . ALLERGIC RHINITIS 07/07/2006  . GERD 07/07/2006    [name] PT  11/01/2015, 3:33 PM  [facility] [place], [place], [address] Phone: [phone]   Fax:  [phone]  Name: Kelly Robles MRN: [id number] Date of Birth: 05/03/1945

## 2015-11-01 NOTE — Progress Notes (Signed)
  [place] OFFICE PROGRESS NOTE   Diagnosis: Amyloidosis  INTERVAL HISTORY:   Kelly Robles returns as scheduled. She feels well. No recent infection. No new skin lesions aside from a few tiny hyperpigmented moles at the forehead.  Objective:  Vital signs in last 24 hours:  Blood pressure 130/67, pulse 65, temperature 98.1 F (36.7 C), temperature source Oral, resp. rate 18, height 5' 2.5" (1.588 m), weight 154 lb 14.4 oz (70.3 kg), SpO2 99 %.    HEENT: Neck without mass Lymphatics: No cervical, supra-clavicular, axillary, or inguinal nodes Resp: Lungs clear bilaterally Cardio: Regular rate and rhythm GI: No hepatosplenomegaly Vascular: No leg edema  Skin: 2 areas of brown discoloration at the left pretibial region-one of these areas appears to be a previous biopsy site. Raised 2 cm area purplish discoloration at the upper right pretibial area. Inferior to this there are 2 Brown flat lesions using approximately 1 cm each. 2-3 mm flat hyperpigmented moles at the left forehead    Lab Results:  Lab Results  Component Value Date   WBC 5.7 10/24/2015   HGB 13.4 10/24/2015   HCT 38.5 10/24/2015   MCV 93.4 10/24/2015   PLT 197 10/24/2015   NEUTROABS 3.6 10/24/2015  Grinding 0.8, calcium 9.5 Beta-2 microglobulin 2.3 Kappa free light chains 28.5 (67.4 on 04/10/2015)   Medications: I have reviewed the patient's current medications.  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 Discoid lupus  3. Sjogren's syndrome  4. Hepatitis B surface antibody positive July 2015-likely related to vaccination, hepatitis B surface antigen negative 02/03/2014   5. Osteoporosis      Disposition:  Kelly Robles appears stable. There is no clinical or laboratory evidence for progression of the amyloid. No evidence for evolution to multiple myeloma. She will see Dr. Ronnald Ramp within the next few weeks. She will return for an office and lab visit in 9 months.  Betsy Coder, MD  11/01/2015  12:22 PM

## 2015-11-01 NOTE — Patient Instructions (Signed)
Pelvic Press   -     Start doing these 2-3 times a week at a minimum.   Place hands under belly between navel and pubic bone, palms up. Feel pressure on hands. Increase pressure on hands by pressing pelvis down. This is NOT a pelvic tilt. Hold __5_ seconds. Relax. Repeat _10__ times. Once a day.  KNEE: flexion - prone - place hands under hip bones.    - Hold pelvic press. Bend knee, then return the foot down. Repeat on opposite leg. Do not raise hips. _10__ reps per set.Then pull both heels up at same time, x 10 reps.  Once a day  Leg Lift: One-Leg   Press pelvis down. Keep knee straight; lengthen and lift one leg (from waist). Do not twist body. Keep other leg down. Hold _1__ seconds. Relax. Repeat 10 time. Repeat with other leg. Once a day  HIP: Extension / KNEE: Flexion - Prone    _ have hands under hip bones    Hold pelvic press. Bend knee, squeeze glutes. Raise leg up  10___ reps per set, _1__ sets per day, _1__ time a day.   Axial Extension- Upper body sequence * always start with pelvic press    Lie on stomach with forehead resting on floor and arms at sides. Tuck chin in and raise head from floor without bending it up or down. Repeat ___10_ times per set. Do __1__ sets per session. Do _1___ sessions per day.  Progression:  Arms at side Arms in T shape Arms in W shape  Arms in M shape Arms in Y shape     Closed Chain activities - foot/hand stay in contact with a surface.  ( bike, stepper, elliptical, push ups )  Open Chain activities - foot/hand come away from surface and then reengage. ( walking, dancing, stairs, using walking sticks in arms )   Walking - recommend walking in all directions, forward, backwards, side to side for bone growth in all directions.    [facility] [place], [place] [address]  [phone] (office) [phone] (fax)

## 2015-11-06 ENCOUNTER — Encounter: Payer: Medicare Other | Admitting: Physical Therapy

## 2015-11-09 ENCOUNTER — Ambulatory Visit (INDEPENDENT_AMBULATORY_CARE_PROVIDER_SITE_OTHER): Payer: Medicare Other | Admitting: Physical Therapy

## 2015-11-09 ENCOUNTER — Encounter: Payer: Self-pay | Admitting: Physical Therapy

## 2015-11-09 DIAGNOSIS — R293 Abnormal posture: Secondary | ICD-10-CM | POA: Diagnosis not present

## 2015-11-09 DIAGNOSIS — M2560 Stiffness of unspecified joint, not elsewhere classified: Secondary | ICD-10-CM

## 2015-11-09 DIAGNOSIS — M6281 Muscle weakness (generalized): Secondary | ICD-10-CM | POA: Diagnosis not present

## 2015-11-09 NOTE — Therapy (Signed)
[facility] [place], [place], [address] Phone: [phone]   Fax:  [phone]  Physical Therapy Treatment  Patient Details  Name: Kelly Robles MRN: [id number] Date of Birth: [birth year] Referring Provider: Dr Velna Hatchet  Encounter Date: 11/09/2015      PT End of Session - 11/09/15 1455    Visit Number 6   Number of Visits 12   Date for PT Re-Evaluation 12/19/15   PT Start Time 1455   PT Stop Time 1551   PT Time Calculation (min) 56 min   Activity Tolerance Patient tolerated treatment well      Past Medical History:  Diagnosis Date  . Allergy   . COLONIC POLYPS, ADENOMATOUS 10/26/2003   Qualifier: Diagnosis of  By: Arnoldo Morale MD, [place] DISEASE 07/07/2006   Qualifier: Diagnosis of  By: Tiney Rouge CMA, Ellison Hughs    . DLE (discoid lupus erythematosus)   . GERD (gastroesophageal reflux disease)   . Hx of adenomatous polyp of colon 05/21/2009  . Hypertension   . Irritable bowel syndrome 07/07/2006   Qualifier: Diagnosis of  By: Tiney Rouge CMA, Ellison Hughs    . Osteoporosis   . Raynaud's disease   . 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 CHOLECYSTECTOMY    . COLONOSCOPY    . TONSILLECTOMY      There were no vitals filed for this visit.      Subjective Assessment - 11/09/15 1455    Subjective Pt having Rt knee pain really bad recently. Making it very hard to get up/down from the floor with exercise and walking   Pain Score 7    Pain Location Knee   Pain Orientation Right   Pain Type Acute pain   Aggravating Factors  transitions and walking                         [facility] Adult PT Treatment/Exercise - 11/09/15 0001      Self-Care   Other Self-Care Comments  very tender in the Rt patellar tendon, no pain with resistive knee ext. Recommend icing once a  day.   discussed purses vs backpacks     Knee/Hip Exercises: Stretches   Passive Hamstring Stretch Both;30 seconds   Quad Stretch Both;30 seconds   Piriformis Stretch Both;30 seconds   Other Knee/Hip Stretches hip ab/adduction with strap   Other Knee/Hip Stretches hip IR/ER with strap                  PT Short Term Goals - 10/23/15 1217      PT SHORT TERM GOAL #1   Title I with initial HEP ( 11/07/15)    Status Achieved     PT SHORT TERM GOAL #2   Title verbalize precautions related to osteoporosis ( 11/07/15)    Status Achieved     PT SHORT TERM GOAL #3   Title demo proper body mechanics with household chores ( 11/07/15)    Status On-going     PT SHORT TERM GOAL #4   Title improve DGI =/> 22/24 ( 11/07/15)    Status On-going           PT Long Term Goals - 11/09/15 1541      PT LONG TERM GOAL #1   Title I with advanced HEP (  12/19/15)    Status On-going     PT LONG TERM GOAL #2   Title demo bilat hip strength =/> 5-/5 ( 12/19/15   Status On-going     PT LONG TERM GOAL #3   Title improve REEDCO posture score =/> 80/100 ( 12/19/15)    Status On-going     PT LONG TERM GOAL #4   Title increase bilat dorsiflexion =/> 18 degrees ( 12/19/15)    Status On-going     PT LONG TERM GOAL #5   Title demo strong contraction of multifidi and TA ( 12/19/15)    Status Achieved               Plan - 11/09/15 1541    Clinical Impression Statement Pt  had a slight set back this week and developed Rt knee pain.  She is doing well with the stretching today. Had some questions about pelvic press routine, these were answered.    Rehab Potential Excellent   PT Frequency 2x / week   PT Duration 8 weeks   PT Treatment/Interventions Moist Heat;Ultrasound;Therapeutic exercise;Taping;Dry needling;Neuromuscular re-education;Cryotherapy;Electrical Stimulation;Stair training;Gait training;Patient/family education   PT Next Visit Plan upper body stretches with strap and  progression to different gym programs   Consulted and Agree with Plan of Care Patient      Patient will benefit from skilled therapeutic intervention in order to improve the following deficits and impairments:  Postural dysfunction, Decreased strength, Improper body mechanics, Decreased range of motion  Visit Diagnosis: Abnormal posture  Muscle weakness (generalized)  Generalized stiffness     Problem List Patient Active Problem List   Diagnosis Date Noted  . 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 Hematoma 05/26/2014  . Hx of adenomatous polyp of colon 05/21/2009  . ANEMIA, B12 DEFICIENCY 08/26/2006  . LUPUS ERYTHEMATOSUS, DISCOID 08/26/2006  . OSTEOPOROSIS 08/26/2006  . Essential hypertension 07/07/2006  . ALLERGIC RHINITIS 07/07/2006  . GERD 07/07/2006    [name] PT  11/09/2015, 3:53 PM  [facility] [place], [place], [address] Phone: [phone]   Fax:  [phone]  Name: Kelly Robles MRN: [id number] Date of Birth: [birth year]

## 2015-11-09 NOTE — Patient Instructions (Addendum)
Straight Leg Raise: With External Leg Rotation -  Perform for knee pain    Lie on back with right leg straight, opposite leg bent. Rotate straight leg out and lift _24-30"___ inches. Repeat __10__ times per set. Do ___3_ sets per session. Do 1____ sessions per day.   Strengthening: Terminal Knee Extension (Supine) - perform for knee pain.     With right knee over bolster, straighten knee by tightening muscles on top of thigh, hold 5-10 sec. Keep bottom of knee on bolster. Repeat __10-20__ times per set. Do ___1_ sets per session. Do __1__ sessions per day.   LEG STRETCH WITH STRAP Tips A  TECHNIQUE: -Use of a multi-looped strap allows stretching of legs with minimal stress on hands, arms and upper body. -Midpoint of strap or end loop is placed under ball of foot. -To ensure isolated movement during the stretch, the strap is sometimes wrapped around the leg in a specific manner. Follow directions closely to ensure maximum benefit. -During all stretching, keep shoulders relaxed.   Copyright  VHI. All rights reserved.  LEG STRETCH WITH STRAP Technique (Continued)  -Active Isolated Stretching technique is used for all stretches. - Movement is made actively through range, then assisted with strap at end of range. - Hold 1-2 seconds only. - Follow each stretch with relaxation. -During all stretches, tighten pelvic floor muscles, "draw in" belly button, and keep lower back neutral.  Hamstring Step 4    Left leg and foot in maximal stretch. Slowly lengthen and press other leg down as close to floor as possible. Keep lower abdominals tight. Warning: Intense stretch. Stay within tolerance. Hold _30__ seconds. Relax lengthened leg slightly. Do not re-bend knee. Repeat press and lengthen __1_ times. Repeat on the other leg.   Hip Adductor With Hip Flexed 90 Degrees - Make sure leg is high enough to feel it on the inner thigh     Wrap strap around left leg, outside calf, inside thigh, hold in  same-side hand. With leg in maximal straight leg raise, pull leg out to side. Control leg movement with arm. Keep opposite leg still. Do not rotate pelvis or trunk. Hold _30__ seconds. Relax leg. Repeat on the other leg.  If control of leg movement is difficult, wrap strap inside calf, outside thigh. Repeat __1_ times.  Hip Abductor With Hip Flexed 90 Degrees    Wrap strap around left leg, inside calf, outside thigh, hold in opposite-side hand. With leg at maximal straight leg raise, pull leg over to other side. Move free arm out at 90, turn head toward free arm. Hold _30__ seconds. Relax leg. Repeat on the other leg. Repeat _1__ times.  Hip Internal Rotator With Hip Flexed 90 Degrees - start with your non-strapped foot on the floor.     Wrap strap around left foot, hold in opposite-side hand. Place ankle on other leg above knee. Lift other foot off floor; press thigh outward, assist with free hand. Hold _30__ seconds. Relax the press only. Repeat on the other leg. Repeat thigh press _1__ times.   Hip External Rotator With Hip Flexed 90 Degrees    Wrap strap around left foot, hold in same-side hand, leg bent 90/90, other leg bent, foot on floor. While pressing raised knee inward, pull foot outward. Hold _30__ seconds. Relax leg and return to start, keeping leg 90/90. Repeat __1_ times, each leg.  Quads / HF, Prone - use the strap around the heel.     Lie face down, knees  together. Grasp one ankle with same-side hand. Use towel if needed to reach. Gently pull foot toward buttock. Hold _30__ seconds. Repeat _1__ times per session. Do _1__ sessions per day. Repeat on the other leg.   Copyright  VHI. All rights reserved.

## 2015-11-13 ENCOUNTER — Ambulatory Visit (INDEPENDENT_AMBULATORY_CARE_PROVIDER_SITE_OTHER): Payer: Medicare Other | Admitting: Physical Therapy

## 2015-11-13 DIAGNOSIS — R293 Abnormal posture: Secondary | ICD-10-CM | POA: Diagnosis not present

## 2015-11-13 DIAGNOSIS — M2560 Stiffness of unspecified joint, not elsewhere classified: Secondary | ICD-10-CM

## 2015-11-13 DIAGNOSIS — M6281 Muscle weakness (generalized): Secondary | ICD-10-CM

## 2015-11-13 NOTE — Therapy (Signed)
[place], [place], [address] Phone: [phone]   Fax:  [phone]  Physical Therapy Treatment  Patient Details  Name: Kelly Robles MRN: [id number] Date of Birth: 01-Mar-1945 Referring Provider: Dr Velna Hatchet  Encounter Date: 11/13/2015      PT End of Session - 11/13/15 1153    Visit Number 7   Number of Visits 12   Date for PT Re-Evaluation 12/19/15   PT Start Time 1153   PT Stop Time 1239   PT Time Calculation (min) 46 min   Activity Tolerance Patient tolerated treatment well      Past Medical History:  Diagnosis Date  . Allergy   . COLONIC POLYPS, ADENOMATOUS 10/26/2003   Qualifier: Diagnosis of  By: Arnoldo Morale MD, [place] DISEASE 07/07/2006   Qualifier: Diagnosis of  By: Tiney Rouge CMA, Ellison Hughs    . DLE (discoid lupus erythematosus)   . GERD (gastroesophageal reflux disease)   . Hx of adenomatous polyp of colon 05/21/2009  . Hypertension   . Irritable bowel syndrome 07/07/2006   Qualifier: Diagnosis of  By: Tiney Rouge CMA, Ellison Hughs    . Osteoporosis   . Raynaud's disease   . 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 CHOLECYSTECTOMY    . COLONOSCOPY    . TONSILLECTOMY      There were no vitals filed for this visit.      Subjective Assessment - 11/13/15 1156    Subjective Kelly Robles reports  she thinks she sat to much on Saturday between work and a concert. Yesterday she had a really bad cramp in her calves, to the point where her muscles were sore all day.  Her computer acted up last niht and caused her stress also.    Currently in Pain? Yes   Pain Location Leg   Pain Orientation Right;Lower   Pain Descriptors / Indicators Aching   Pain Type Acute pain   Pain Onset Yesterday   Pain Frequency Intermittent   Aggravating Factors  not sure happened  at night   Pain Relieving Factors gentle stretching.             [facility] PT Assessment - 11/13/15 0001      Assessment   Medical Diagnosis Osteoporosis     Posture/Postural Control   Posture/Postural Control --  66/100     Dynamic Gait Index   Level Surface Normal   Change in Gait Speed Normal   Gait with Horizontal Head Turns Normal   Gait with Vertical Head Turns Normal   Gait and Pivot Turn Normal   Step Over Obstacle Normal   Step Around Obstacles Normal   Steps Normal   Total Score 24     Timed Up and Go Test   Normal TUG (seconds) 9                               PT Short Term Goals - 11/13/15 1158      PT SHORT TERM GOAL #1   Title I with initial HEP ( 11/07/15)    Status Achieved     PT SHORT TERM GOAL #2   Title verbalize precautions related to osteoporosis ( 11/07/15)    Status Achieved     PT SHORT TERM GOAL #  3   Title Market researcher with household chores ( 11/07/15)    Status Achieved  pt reports improving and remembering to use proper mechanics, becoming a habit.      PT SHORT TERM GOAL #4   Title improve DGI =/> 22/24 ( 11/07/15)    Status Achieved  scored 24/24           PT Long Term Goals - 11/13/15 1243      PT LONG TERM GOAL #1   Title I with advanced HEP ( 12/19/15)    Status On-going     PT LONG TERM GOAL #2   Title demo bilat hip strength =/> 5-/5 ( 12/19/15   Status On-going     PT LONG TERM GOAL #3   Title improve REEDCO posture score =/> 80/100 ( 12/19/15)    Status On-going  66/100 today     PT LONG TERM GOAL #4   Title increase bilat dorsiflexion =/> 18 degrees ( 12/19/15)    Status On-going     PT LONG TERM GOAL #5   Title demo strong contraction of multifidi and TA ( 12/19/15)    Status Achieved               Plan - 11/13/15 1243    Clinical Impression Statement Kelly Robles continues to do very well with this program.  She has met some more goals and progressing to the  others.  She will be out of town for most of the month of November    Rehab Potential Excellent   PT Frequency 2x / week   PT Duration 8 weeks   PT Treatment/Interventions Moist Heat;Ultrasound;Therapeutic exercise;Taping;Dry needling;Neuromuscular re-education;Cryotherapy;Electrical Stimulation;Stair training;Gait training;Patient/family education   PT Next Visit Plan review all exercise, teach progression to gym program.    Consulted and Agree with Plan of Care Patient      Patient will benefit from skilled therapeutic intervention in order to improve the following deficits and impairments:  Postural dysfunction, Decreased strength, Improper body mechanics, Decreased range of motion  Visit Diagnosis: Abnormal posture  Muscle weakness (generalized)  Generalized stiffness     Problem List Patient Active Problem List   Diagnosis Date Noted  . 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 Hematoma 05/26/2014  . Hx of adenomatous polyp of colon 05/21/2009  . ANEMIA, B12 DEFICIENCY 08/26/2006  . LUPUS ERYTHEMATOSUS, DISCOID 08/26/2006  . OSTEOPOROSIS 08/26/2006  . Essential hypertension 07/07/2006  . ALLERGIC RHINITIS 07/07/2006  . GERD 07/07/2006    [name] PT  11/13/2015, 12:45 PM  [facility] [place], [place], [address] Phone: [phone]   Fax:  [phone]  Name: Kelly Robles MRN: [id number] Date of Birth: June 14, 1945

## 2015-11-13 NOTE — Patient Instructions (Addendum)
Triceps, Overhead   Stand and reach one arm over head holding strap behind back, other arm behind back. Pull strap down towards feet behind back to stretch top arm.  Hold _30-45__ seconds. Repeat __1_ times per session on each side. . Do _1__ sessions per day.   External / Internal Rotator Cuff Stretch, Standing    Use a belt or small towel, hook bottom hand, palm out. Pull arm behind back up until stretch is felt in front of shoulder. Hold _30-45__ seconds. Repeat _1__ times per session on each side. Do _1__ sessions per day.   Lat Stretch, Standing    Stand and place palms against wall, shoulder-width apart. Lean upper body forward and push lower back outward. Hinge at the hips.  Hold __30-45_ seconds. Repeat _2__ times per session. Do __1_ sessions per day.  Ball Roll Back, Supine: Advanced ( 65 cm ball)  Do this next to a couch for safety.     Sit on therapy ball. Slide down, stretching middle back over ball. Arms out to the side, palms up.  Hold _6-10__ deep slow breaths. Sit up slowly.  Repeat _1__ times per session. Do _1__ sessions per day.   Supine With Foam Rollers - only use one noodle or a rolled yoga mat.      Lie on back with full rollers under upper back ( bra strap level) . Keep arms relaxed and head supported.  Lower buttocks then stretch upper body over rollers. Breathe out to increase stretch. Hold _6-10__ deep slow breaths. Repeat _1__ times per session. Do _1__ sessions per day.  Copyright  VHI. All rights reserved.

## 2015-11-15 ENCOUNTER — Encounter: Payer: Medicare Other | Admitting: Physical Therapy

## 2015-11-27 ENCOUNTER — Telehealth: Payer: Self-pay | Admitting: General Practice

## 2015-11-27 NOTE — Telephone Encounter (Signed)
Spoke with pt regarding July 2018 appt.. Pt has concerns about appt, sent msg to Audie Clear to followup.

## 2015-11-28 ENCOUNTER — Telehealth: Payer: Self-pay | Admitting: Oncology

## 2015-11-28 NOTE — Telephone Encounter (Signed)
Spoke with patient re lab 7/17 and BS 7/26.

## 2015-12-11 ENCOUNTER — Encounter: Payer: Medicare Other | Admitting: Physical Therapy

## 2015-12-24 ENCOUNTER — Encounter: Payer: Self-pay | Admitting: Physical Therapy

## 2015-12-24 ENCOUNTER — Ambulatory Visit (INDEPENDENT_AMBULATORY_CARE_PROVIDER_SITE_OTHER): Payer: Medicare Other | Admitting: Physical Therapy

## 2015-12-24 DIAGNOSIS — R293 Abnormal posture: Secondary | ICD-10-CM

## 2015-12-24 DIAGNOSIS — M2560 Stiffness of unspecified joint, not elsewhere classified: Secondary | ICD-10-CM

## 2015-12-24 DIAGNOSIS — M6281 Muscle weakness (generalized): Secondary | ICD-10-CM | POA: Diagnosis not present

## 2015-12-24 NOTE — Therapy (Signed)
[place], [place], [address] Phone: [phone]   Fax:  [phone]  Physical Therapy Treatment  Patient Details  Name: Kelly Robles MRN: [id number] Date of Birth: 18-Apr-1945 Referring Provider: Dr Velna Hatchet  Encounter Date: 12/24/2015      PT End of Session - 12/24/15 1108    Visit Number 8   Number of Visits 12   Date for PT Re-Evaluation 01/21/16   PT Start Time 1109   PT Stop Time 1146   PT Time Calculation (min) 37 min   Activity Tolerance Patient tolerated treatment well      Past Medical History:  Diagnosis Date  . Allergy   . COLONIC POLYPS, ADENOMATOUS 10/26/2003   Qualifier: Diagnosis of  By: Arnoldo Morale MD, [place] DISEASE 07/07/2006   Qualifier: Diagnosis of  By: Tiney Rouge CMA, Ellison Hughs    . DLE (discoid lupus erythematosus)   . GERD (gastroesophageal reflux disease)   . Hx of adenomatous polyp of colon 05/21/2009  . Hypertension   . Irritable bowel syndrome 07/07/2006   Qualifier: Diagnosis of  By: Tiney Rouge CMA, Ellison Hughs    . Osteoporosis   . Raynaud's disease   . 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 CHOLECYSTECTOMY    . COLONOSCOPY    . TONSILLECTOMY      There were no vitals filed for this visit.      Subjective Assessment - 12/24/15 1109    Subjective Kelly Robles reports she had a lot of pain on the outsides of her legs while she was gone. She stopped some of the exercises and it seems to have settled down.  She was in a 3 story house and had to go up/down steps. She also had increased pain in her knees.    Patient Stated Goals safe exercises to do and work on balance and anything  else she needs.             [facility] PT Assessment - 12/24/15 0001      Assessment   Medical Diagnosis Osteoporosis   Referring Provider Dr Velna Hatchet   Onset Date/Surgical Date 10/10/14   Hand Dominance Right   Next MD Visit PRN     Precautions   Precaution Comments osteoporosis - no forward flexion, limit rotation     Balance Screen   Has the patient fallen in the past 6 months No   Has the patient had a decrease in activity level because of a fear of falling?  No   Is the patient reluctant to leave their home because of a fear of falling?  No     Functional Tests   Functional tests Single leg stance;Squat     Squat   Comments bilat knees over toes and slight pain     Single Leg Stance   Comments Rt 4/6/9, Lt 16/5/13     Posture/Postural Control   Posture/Postural Control --  REEDCO 79/10     AROM   Right/Left Hip --  ER Rt/LT 55/45 , IR Rt/Lt 45/40   Right/Left Ankle --  DF A/PROM Rt 10/15, Lt 12/18     Strength   Right Hip Flexion 5/5   Right Hip Extension 4/5   Right Hip ABduction 4+/5   Left Hip Flexion --  5-/5   Left Hip  Extension 4+/5   Left Hip ABduction 4-/5   Right/Left Knee --  Rt WNL   Left Knee Flexion 5/5   Left Knee Extension 4+/5   Right/Left Ankle --  bilat WNL                     [facility] Adult PT Treatment/Exercise - 12/24/15 0001      Lumbar Exercises: Supine   Clam 20 reps  with red band   Clam Limitations then 10 reps single leg   Bridge 10 reps  then figure 8 bridges                  PT Short Term Goals - 11/13/15 1158      PT SHORT TERM GOAL #1   Title I with initial HEP ( 11/07/15)    Status Achieved     PT SHORT TERM GOAL #2   Title verbalize precautions related to osteoporosis ( 11/07/15)    Status Achieved     PT SHORT TERM GOAL #3   Title demo proper body mechanics with household chores ( 11/07/15)    Status Achieved  pt reports improving and remembering to use proper mechanics, becoming a habit.      PT SHORT TERM GOAL #4   Title improve DGI =/> 22/24 ( 11/07/15)    Status Achieved  scored 24/24           PT Long Term  Goals - 12/24/15 1112      PT LONG TERM GOAL #1   Title I with advanced HEP to include eccentric quad work ( 01/21/16)    Time 4   Period Weeks   Status Revised     PT LONG TERM GOAL #2   Title demo bilat hip strength =/> 5-/5 ( 01/21/16)    Time 4   Period Weeks   Status On-going     PT LONG TERM GOAL #3   Title improve REEDCO posture score =/> 80/100 ( 01/21/16)    Status On-going  79/100     PT LONG TERM GOAL #4   Title increase bilat dorsiflexion =/> 18 degrees ( 01/21/16)    Time 4   Period Weeks   Status Partially Met     PT LONG TERM GOAL #5   Title demo strong contraction of multifidi and TA ( 12/19/15)    Status Achieved               Plan - 12/24/15 1134    Clinical Impression Statement Kelly Robles has been out of town for the last month.  She had a lot of stairs where she was and developed bilat knee and lateral leg pain.  She stopped some of her lower body exercise due to the pain.  Recommend she add these back in now that she is home.  She has partially met her goals.  Has weakness in her hips and higher level weakness in her quads.  She would benefit from continued therapy to address this weakness along with some core weakness.  Her posture and ROM are improving and almost met these goal.    Rehab Potential Excellent   PT Frequency 1x / week   PT Duration 4 weeks   PT Treatment/Interventions Moist Heat;Ultrasound;Therapeutic exercise;Taping;Dry needling;Neuromuscular re-education;Cryotherapy;Electrical Stimulation;Stair training;Gait training;Patient/family education   PT Next Visit Plan progress lower body ther ex for strengthening   Consulted and Agree with Plan of Care Patient      Patient will benefit  from skilled therapeutic intervention in order to improve the following deficits and impairments:  Postural dysfunction, Decreased strength, Improper body mechanics, Decreased range of motion  Visit Diagnosis: Generalized stiffness - Plan: PT PLAN OF CARE  CERT/RE-CERT  Muscle weakness (generalized) - Plan: PT PLAN OF CARE CERT/RE-CERT  Abnormal posture - Plan: PT PLAN OF CARE CERT/RE-CERT     Problem List Patient Active Problem List   Diagnosis Date Noted  . 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 Hematoma 05/26/2014  . Hx of adenomatous polyp of colon 05/21/2009  . ANEMIA, B12 DEFICIENCY 08/26/2006  . LUPUS ERYTHEMATOSUS, DISCOID 08/26/2006  . OSTEOPOROSIS 08/26/2006  . Essential hypertension 07/07/2006  . ALLERGIC RHINITIS 07/07/2006  . GERD 07/07/2006    Kelly Robles PT  12/24/2015, 11:46 AM  [facility] [place], [place], [address] Phone: [phone]   Fax:  [phone]  Name: Kelly Robles MRN: [id number] Date of Birth: 05-May-1945

## 2015-12-27 ENCOUNTER — Encounter: Payer: Medicare Other | Admitting: Physical Therapy

## 2016-01-04 ENCOUNTER — Encounter: Payer: Self-pay | Admitting: Physical Therapy

## 2016-01-04 ENCOUNTER — Ambulatory Visit (INDEPENDENT_AMBULATORY_CARE_PROVIDER_SITE_OTHER): Payer: Medicare Other | Admitting: Physical Therapy

## 2016-01-04 DIAGNOSIS — M2560 Stiffness of unspecified joint, not elsewhere classified: Secondary | ICD-10-CM | POA: Diagnosis not present

## 2016-01-04 DIAGNOSIS — M6281 Muscle weakness (generalized): Secondary | ICD-10-CM

## 2016-01-04 DIAGNOSIS — R293 Abnormal posture: Secondary | ICD-10-CM

## 2016-01-04 NOTE — Therapy (Signed)
[place], [place], [address] Phone: [phone]   Fax:  [phone]  Physical Therapy Treatment  Patient Details  Name: Kelly Robles MRN: [id number] Date of Birth: 1945-12-24 Referring Provider: Dr Velna Hatchet  Encounter Date: 01/04/2016      PT End of Session - 01/04/16 0938    Visit Number 9   Number of Visits 12   Date for PT Re-Evaluation 01/21/16   PT Start Time 0933   PT Stop Time 1014   PT Time Calculation (min) 41 min   Activity Tolerance Patient tolerated treatment well      Past Medical History:  Diagnosis Date  . Allergy   . COLONIC POLYPS, ADENOMATOUS 10/26/2003   Qualifier: Diagnosis of  By: Arnoldo Morale MD, [place] DISEASE 07/07/2006   Qualifier: Diagnosis of  By: Tiney Rouge CMA, Ellison Hughs    . DLE (discoid lupus erythematosus)   . GERD (gastroesophageal reflux disease)   . Hx of adenomatous polyp of colon 05/21/2009  . Hypertension   . Irritable bowel syndrome 07/07/2006   Qualifier: Diagnosis of  By: Tiney Rouge CMA, Ellison Hughs    . Osteoporosis   . Raynaud's disease   . 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 CHOLECYSTECTOMY    . COLONOSCOPY    . TONSILLECTOMY      There were no vitals filed for this visit.      Subjective Assessment - 01/04/16 0939    Subjective Kelly Robles is concerned about her knee and not having the pain return.  She also has periods of unsteadiness due to the knee.    Patient Stated Goals safe exercises to do and work on balance and anything  else she needs.    Currently in Pain? No/denies            [facility] PT Assessment - 01/04/16 0001      Assessment   Medical Diagnosis Osteoporosis   Referring Provider Dr Velna Hatchet   Onset Date/Surgical Date 10/10/14   Hand Dominance Right   Next MD Visit PRN                      [facility] Adult PT Treatment/Exercise - 01/04/16 0001      Lumbar Exercises: Aerobic   Stationary Bike Nustep L5x5'     Lumbar Exercises: Supine   Bridge 20 reps  each side figure 4   Other Supine Lumbar Exercises 2x10 SLR with ER each side   Other Supine Lumbar Exercises supine over yoga eggs in thoracic area with deep slow breathing.      Lumbar Exercises: Quadruped   Opposite Arm/Leg Raise Left arm/Right leg;Right arm/Left leg;10 reps                PT Education - 01/04/16 0949    Education provided Yes   Education Details HEP   Person(s) Educated Patient   Methods Explanation;Demonstration;Handout   Comprehension Verbalized understanding;Returned demonstration          PT Short Term Goals - 11/13/15 1158      PT SHORT TERM GOAL #1   Title I with initial HEP ( 11/07/15)    Status Achieved     PT SHORT TERM GOAL #2   Title verbalize precautions related to osteoporosis ( 11/07/15)    Status Achieved  PT SHORT TERM GOAL #3   Title demo proper body mechanics with household chores ( 11/07/15)    Status Achieved  pt reports improving and remembering to use proper mechanics, becoming a habit.      PT SHORT TERM GOAL #4   Title improve DGI =/> 22/24 ( 11/07/15)    Status Achieved  scored 24/24           PT Long Term Goals - 01/04/16 1002      PT LONG TERM GOAL #1   Title I with advanced HEP to include eccentric quad work ( 01/21/16)    Status On-going     PT LONG TERM GOAL #2   Title demo bilat hip strength =/> 5-/5 ( 01/21/16)    Status On-going     PT LONG TERM GOAL #3   Title improve REEDCO posture score =/> 80/100 ( 01/21/16)    Status On-going     PT LONG TERM GOAL #4   Title increase bilat dorsiflexion =/> 18 degrees ( 01/21/16)    Status Partially Met     PT LONG TERM GOAL #5   Title demo strong contraction of multifidi and TA ( 12/19/15)    Status Achieved               Plan - 01/04/16 1001     Clinical Impression Statement Pt is doing very well with her HEP, she was able to tolerate a higher level exercise in quadruped with minimal difficulty.  Progressing to goals.     Rehab Potential Excellent   PT Frequency 1x / week   PT Duration 4 weeks   PT Treatment/Interventions Moist Heat;Ultrasound;Therapeutic exercise;Taping;Dry needling;Neuromuscular re-education;Cryotherapy;Electrical Stimulation;Stair training;Gait training;Patient/family education   PT Next Visit Plan progress lower body ther ex for strengthening and core   Consulted and Agree with Plan of Care Patient      Patient will benefit from skilled therapeutic intervention in order to improve the following deficits and impairments:  Postural dysfunction, Decreased strength, Improper body mechanics, Decreased range of motion  Visit Diagnosis: Abnormal posture  Muscle weakness (generalized)  Generalized stiffness     Problem List Patient Active Problem List   Diagnosis Date Noted  . 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 Hematoma 05/26/2014  . Hx of adenomatous polyp of colon 05/21/2009  . ANEMIA, B12 DEFICIENCY 08/26/2006  . LUPUS ERYTHEMATOSUS, DISCOID 08/26/2006  . OSTEOPOROSIS 08/26/2006  . Essential hypertension 07/07/2006  . ALLERGIC RHINITIS 07/07/2006  . GERD 07/07/2006    [name] PT 01/04/2016, 10:14 AM  [facility] [place], [place], [address] Phone: [phone]   Fax:  [phone]  Name: [name] MRN: [id number] Date of Birth: Jan 29, 1945

## 2016-01-04 NOTE — Patient Instructions (Signed)
   Nustep ( level or load 5)

## 2016-01-09 ENCOUNTER — Ambulatory Visit (INDEPENDENT_AMBULATORY_CARE_PROVIDER_SITE_OTHER): Payer: Medicare Other | Admitting: Physical Therapy

## 2016-01-09 ENCOUNTER — Encounter: Payer: Self-pay | Admitting: Physical Therapy

## 2016-01-09 DIAGNOSIS — M2560 Stiffness of unspecified joint, not elsewhere classified: Secondary | ICD-10-CM | POA: Diagnosis not present

## 2016-01-09 DIAGNOSIS — R293 Abnormal posture: Secondary | ICD-10-CM | POA: Diagnosis not present

## 2016-01-09 DIAGNOSIS — M6281 Muscle weakness (generalized): Secondary | ICD-10-CM | POA: Diagnosis not present

## 2016-01-09 NOTE — Therapy (Signed)
[place], [place], [address] Phone: [phone]   Fax:  [phone]  Physical Therapy Treatment  Patient Details  Name: Kelly Robles MRN: [id number] Date of Birth: 1945/07/28 Referring Provider: Dr Velna Hatchet  Encounter Date: 01/09/2016      PT End of Session - 01/09/16 1112    Visit Number 10   Number of Visits 12   Date for PT Re-Evaluation 01/21/16   PT Start Time 1110  in late   PT Stop Time 1146   PT Time Calculation (min) 36 min      Past Medical History:  Diagnosis Date  . Allergy   . COLONIC POLYPS, ADENOMATOUS 10/26/2003   Qualifier: Diagnosis of  By: Arnoldo Morale MD, [place] DISEASE 07/07/2006   Qualifier: Diagnosis of  By: Tiney Rouge CMA, Ellison Hughs    . DLE (discoid lupus erythematosus)   . GERD (gastroesophageal reflux disease)   . Hx of adenomatous polyp of colon 05/21/2009  . Hypertension   . Irritable bowel syndrome 07/07/2006   Qualifier: Diagnosis of  By: Tiney Rouge CMA, Ellison Hughs    . Osteoporosis   . Raynaud's disease   . 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 CHOLECYSTECTOMY    . COLONOSCOPY    . TONSILLECTOMY      There were no vitals filed for this visit.      Subjective Assessment - 01/09/16 1113    Subjective Pt had a good Christmas, has been feeling pretty good since her last visit. Still having some catching in her Rt knee.    Patient Stated Goals safe exercises to do and work on balance and anything  else she needs.    Currently in Pain? No/denies            [facility] PT Assessment - 01/09/16 0001      Assessment   Medical Diagnosis Osteoporosis                     [facility] Adult PT Treatment/Exercise - 01/09/16 0001      Lumbar Exercises: Aerobic   Stationary Bike Nustep L5x5'     Lumbar Exercises:  Standing   Wall Slides 1 second;20 reps  VC for form   Other Standing Lumbar Exercises chest opening stretches with strap in the doorway prior to lying over yoga eggs.      Lumbar Exercises: Supine   Clam 20 reps  with red  band, then 10 reps single side   Other Supine Lumbar Exercises 20 reps head presses into ball off EOB   Other Supine Lumbar Exercises supine over yoga eggs in thoracic area with deep slow breathing.                   PT Short Term Goals - 11/13/15 1158      PT SHORT TERM GOAL #1   Title I with initial HEP ( 11/07/15)    Status Achieved     PT SHORT TERM GOAL #2   Title verbalize precautions related to osteoporosis ( 11/07/15)    Status Achieved     PT SHORT TERM GOAL #3   Title demo proper body mechanics with household chores ( 11/07/15)    Status Achieved  pt reports improving and remembering to use proper mechanics, becoming a habit.  PT SHORT TERM GOAL #4   Title improve DGI =/> 22/24 ( 11/07/15)    Status Achieved  scored 24/24           PT Long Term Goals - 01/09/16 1128      PT LONG TERM GOAL #1   Title I with advanced HEP to include eccentric quad work ( 01/21/16)    Status On-going     PT LONG TERM GOAL #2   Title demo bilat hip strength =/> 5-/5 ( 01/21/16)    Status On-going     PT LONG TERM GOAL #3   Title improve REEDCO posture score =/> 80/100 ( 01/21/16)    Status On-going     PT LONG TERM GOAL #4   Title increase bilat dorsiflexion =/> 18 degrees ( 01/21/16)    Status On-going     PT LONG TERM GOAL #5   Title demo strong contraction of multifidi and TA ( 12/19/15)    Status Achieved             Patient will benefit from skilled therapeutic intervention in order to improve the following deficits and impairments:     Visit Diagnosis: Abnormal posture  Muscle weakness (generalized)  Generalized stiffness     Problem List Patient Active Problem List   Diagnosis Date Noted  . Amyloidosis ([place])  05/26/2014  . Hematoma 05/26/2014  . Hx of adenomatous polyp of colon 05/21/2009  . ANEMIA, B12 DEFICIENCY 08/26/2006  . LUPUS ERYTHEMATOSUS, DISCOID 08/26/2006  . OSTEOPOROSIS 08/26/2006  . Essential hypertension 07/07/2006  . ALLERGIC RHINITIS 07/07/2006  . GERD 07/07/2006    [name] PT 01/09/2016, 12:40 PM  [facility] [place], [place], [address] Phone: [phone]   Fax:  [phone]  Name: Kelly Robles MRN: [id number] Date of Birth: Sep 21, 1945

## 2016-01-16 ENCOUNTER — Encounter: Payer: Medicare Other | Admitting: Physical Therapy

## 2016-01-17 ENCOUNTER — Encounter: Payer: Medicare Other | Admitting: Physical Therapy

## 2016-01-23 ENCOUNTER — Ambulatory Visit (INDEPENDENT_AMBULATORY_CARE_PROVIDER_SITE_OTHER): Payer: Medicare Other | Admitting: Physical Therapy

## 2016-01-23 ENCOUNTER — Encounter: Payer: Self-pay | Admitting: Physical Therapy

## 2016-01-23 DIAGNOSIS — R293 Abnormal posture: Secondary | ICD-10-CM

## 2016-01-23 DIAGNOSIS — M2560 Stiffness of unspecified joint, not elsewhere classified: Secondary | ICD-10-CM | POA: Diagnosis not present

## 2016-01-23 DIAGNOSIS — M6281 Muscle weakness (generalized): Secondary | ICD-10-CM

## 2016-01-23 NOTE — Therapy (Signed)
[place], [place], [address] Phone: [phone]   Fax:  [phone]  Physical Therapy Treatment  Patient Details  Name: Kelly Robles MRN: [id number] Date of Birth: [birth year] Referring Provider: Dr Velna Hatchet  Encounter Date: 01/23/2016      PT End of Session - 01/23/16 1103    Visit Number 11   Date for PT Re-Evaluation 01/21/16   PT Start Time 1103   PT Stop Time (P)  1142   PT Time Calculation (min) (P)  39 min      Past Medical History:  Diagnosis Date  . Allergy   . COLONIC POLYPS, ADENOMATOUS 10/26/2003   Qualifier: Diagnosis of  By: Arnoldo Morale MD, [place] DISEASE 07/07/2006   Qualifier: Diagnosis of  By: Tiney Rouge CMA, Ellison Hughs    . DLE (discoid lupus erythematosus)   . GERD (gastroesophageal reflux disease)   . Hx of adenomatous polyp of colon 05/21/2009  . Hypertension   . Irritable bowel syndrome 07/07/2006   Qualifier: Diagnosis of  By: Tiney Rouge CMA, Ellison Hughs    . Osteoporosis   . Raynaud's disease   . 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 CHOLECYSTECTOMY    . COLONOSCOPY    . TONSILLECTOMY      There were no vitals filed for this visit.      Subjective Assessment - 01/23/16 1103    Subjective This is my last session and I want to make sure I am doing all my exercises correctly and prioritize the ones to do today.    Currently in Pain? No/denies            [facility] PT Assessment - 01/23/16 0001      Assessment   Medical Diagnosis Osteoporosis   Referring Provider Dr Velna Hatchet   Onset Date/Surgical Date 10/10/14     Single Leg Stance   Comments Rt 30 sec, Lt 04/21/15     Posture/Postural Control   Posture/Postural Control --  REEDCO 85/100     AROM   Right/Left Ankle --  A/PROM Rt 13/19, Lt 10/16     Strength   Right  Hip Flexion 5/5   Right Hip Extension --  5-/5   Right Hip ABduction 5/5   Left Hip Flexion --  5-/5   Left Hip Extension --  5-/5   Left Hip ABduction 4+/5   Left Knee Flexion 5/5   Left Knee Extension --  5-/5                     [facility] Adult PT Treatment/Exercise - 01/23/16 0001      Self-Care   Other Self-Care Comments  answered pt ?'s. regarding ordering DVDs, hip hinges and pelvic press series.      Lumbar Exercises: Supine   Other Supine Lumbar Exercises reviewed leg lengtheners - she has good form.      Lumbar Exercises: Prone   Other Prone Lumbar Exercises practiced pelvic press routine with isolation of lumbar multifidi and not using accessory muscles.                 PT Education - 01/23/16 1338    Education provided Yes   Education Details reviewed all her HEP, some verbally and others by practicing.    Person(s) Educated Patient  Methods Explanation;Demonstration;Handout   Comprehension Returned demonstration;Verbalized understanding          PT Short Term Goals - 01/23/16 1340      PT SHORT TERM GOAL #1   Title I with initial HEP ( 11/07/15)    Status Achieved     PT SHORT TERM GOAL #2   Title verbalize precautions related to osteoporosis ( 11/07/15)    Status Achieved     PT SHORT TERM GOAL #3   Title demo proper body mechanics with household chores ( 11/07/15)    Status Achieved     PT SHORT TERM GOAL #4   Title improve DGI =/> 22/24 ( 11/07/15)    Status Achieved           PT Long Term Goals - 01/23/16 1341      PT LONG TERM GOAL #1   Title I with advanced HEP to include eccentric quad work ( 01/21/16)    Status Achieved     PT LONG TERM GOAL #2   Title demo bilat hip strength =/> 5-/5 ( 01/21/16)    Status Partially Met     PT LONG TERM GOAL #3   Title improve REEDCO posture score =/> 80/100 ( 01/21/16)    Status Achieved  scored 85/100     PT LONG TERM GOAL #4   Title increase bilat dorsiflexion =/> 18  degrees ( 01/21/16)    Status Partially Met     PT LONG TERM GOAL #5   Title demo strong contraction of multifidi and TA ( 12/19/15)    Status Achieved               Plan - 01/23/16 1139    Clinical Impression Statement This is Kelly Robles's last visit,  she has done well with PT, demonstrates a great understanding of exercise and movement patterns that are safe for osteoporosis.  she has increased her ROM, strength and improved her posture.    PT Next Visit Plan see again in ~ 4 months, recheck motion, REEDCO and strength   Consulted and Agree with Plan of Care Patient      Patient will benefit from skilled therapeutic intervention in order to improve the following deficits and impairments:     Visit Diagnosis: Abnormal posture  Muscle weakness (generalized)  Generalized stiffness     Problem List Patient Active Problem List   Diagnosis Date Noted  . 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 Hematoma 05/26/2014  . Hx of adenomatous polyp of colon 05/21/2009  . ANEMIA, B12 DEFICIENCY 08/26/2006  . LUPUS ERYTHEMATOSUS, DISCOID 08/26/2006  . OSTEOPOROSIS 08/26/2006  . Essential hypertension 07/07/2006  . ALLERGIC RHINITIS 07/07/2006  . GERD 07/07/2006    [name] PT 01/23/2016, 1:42 PM  [facility] [place], [place], [address] Phone: [phone]   Fax:  [phone]  Name: Kelly Robles MRN: [id number] Date of Birth: [birth year]  PHYSICAL THERAPY DISCHARGE SUMMARY  Visits from Start of Care: 11  Current functional level related to goals / functional outcomes: See above   Remaining deficits: None, she is doing very well   Education / Equipment: HEP and osteoporosis education Plan: Patient agrees to discharge.  Patient goals were partially met. Patient is being discharged due to being pleased with the current functional level.  ?????Patient should continue to improve with performance of  HEP    [name], PT 01/23/16 1:44 PM

## 2016-03-21 ENCOUNTER — Telehealth: Payer: Self-pay | Admitting: *Deleted

## 2016-03-21 ENCOUNTER — Telehealth: Payer: Self-pay | Admitting: Oncology

## 2016-03-21 NOTE — Telephone Encounter (Signed)
Call placed back to patient to check on her regarding earlier call with [facility] where patient stated that she felt she needed an earlier appt.with Dr. Benay Spice.  Patient states that she has had new red spots that have arrived on her right arm and hand, increased weakness and some AM heart palpitations. Patient instructed that I would review her symptoms with Dr. Benay Spice and call her back with his recommendations.

## 2016-03-21 NOTE — Telephone Encounter (Signed)
Call placed to patient to notify her that Dr. Benay Spice can see her on 04/03/16 at 12:00PM.  Message sent to scheduling.  Patient appreciative of call back.

## 2016-03-21 NOTE — Telephone Encounter (Signed)
Called patient to confirm appointment and she said that she needs to make an appointment in additition to the one in July because she has been having problems but will call back and talk to a nurse that I was not a good time at the moment

## 2016-03-25 ENCOUNTER — Telehealth: Payer: Self-pay | Admitting: Oncology

## 2016-03-25 NOTE — Telephone Encounter (Signed)
Left message for Patient about scheduled appt for 3/22 per RN Lorriane Shire. 1200 pm time unavailable , per RN Tanya next available at 330 pm is okay.

## 2016-04-03 ENCOUNTER — Ambulatory Visit (HOSPITAL_BASED_OUTPATIENT_CLINIC_OR_DEPARTMENT_OTHER): Payer: Medicare Other | Admitting: Oncology

## 2016-04-03 ENCOUNTER — Telehealth: Payer: Self-pay | Admitting: Oncology

## 2016-04-03 VITALS — BP 125/75 | HR 60 | Temp 97.6°F | Resp 18 | Wt 151.0 lb

## 2016-04-03 DIAGNOSIS — E859 Amyloidosis, unspecified: Secondary | ICD-10-CM

## 2016-04-03 DIAGNOSIS — R002 Palpitations: Secondary | ICD-10-CM

## 2016-04-03 DIAGNOSIS — M810 Age-related osteoporosis without current pathological fracture: Secondary | ICD-10-CM | POA: Diagnosis not present

## 2016-04-03 NOTE — Progress Notes (Signed)
  [place] OFFICE PROGRESS NOTE   Diagnosis:  Amyloidosis  INTERVAL HISTORY:   Kelly Robles returns prior to a scheduled visit. She has noted an erythematous lesion at the dorsum of the left hand. She bruises easily. She complains of waking up during the night with palpitations and "sweats ". This happened several nights per week. She has a history of palpitations. She currently has no palpitations or sweating during the day. Good appetite. No dyspnea or fever.  Objective:  Vital signs in last 24 hours:  Blood pressure 125/75, pulse 60, temperature 97.6 F (36.4 C), temperature source Oral, resp. rate 18, weight 151 lb (68.5 kg), SpO2 100 %.    HEENT: The tongue appears normal Resp: Lungs clear bilaterally Cardio: Regular rate and rhythm, no murmur, no gallop GI: No hepatosplenomegaly Vascular: No leg edema  Skin: Less than 1 cm flat faint erythematous lesion at the dorsum of the left hand, nodular 2 cm purplish lesion at the right upper pretibial area , brown discoloration at left pretibial biopsy sites without nodularity      Medications: I have reviewed the patient's current medications.  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 Discoid lupus  3. Sjogren's syndrome  4. Hepatitis B surface antibody positive July 2015-likely related to vaccination, hepatitis B surface antigen negative 02/03/2014  5. 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 There is no clinical evidence of disease progression. The free kappa light chains were lower in October 2017. I doubt the palpitations are related to the  amyloidosis. I recommended she follow-up with Dr. Ardeth Perfect. She reports a history of palpitations and was placed on carvedilol in the past.  She will return for an office visit as scheduled in July.  15 minutes were spent with the patient today. The majority of the time was used for counseling and coordination of care.  Betsy Coder, MD  04/03/2016  3:32 PM

## 2016-04-03 NOTE — Telephone Encounter (Signed)
Gave patient AVS and calender per 3/22 los. Per MD Benay Spice okay to schedule on 7/25

## 2016-07-29 ENCOUNTER — Other Ambulatory Visit (HOSPITAL_BASED_OUTPATIENT_CLINIC_OR_DEPARTMENT_OTHER): Payer: Medicare Other

## 2016-07-29 DIAGNOSIS — E859 Amyloidosis, unspecified: Secondary | ICD-10-CM | POA: Diagnosis not present

## 2016-07-29 LAB — BASIC METABOLIC PANEL
ANION GAP: 10 meq/L (ref 3–11)
BUN: 10.5 mg/dL (ref 7.0–26.0)
CHLORIDE: 107 meq/L (ref 98–109)
CO2: 25 mEq/L (ref 22–29)
Calcium: 9.5 mg/dL (ref 8.4–10.4)
Creatinine: 0.8 mg/dL (ref 0.6–1.1)
EGFR: 79 mL/min/{1.73_m2} — ABNORMAL LOW (ref >90)
GLUCOSE: 83 mg/dL (ref 70–140)
Potassium: 4.1 mEq/L (ref 3.5–5.1)
Sodium: 143 mEq/L (ref 136–145)

## 2016-07-29 LAB — CBC WITH DIFFERENTIAL/PLATELET
BASO%: 0.2 % (ref 0.0–2.0)
Basophils Absolute: 0 10*3/uL (ref 0.0–0.1)
EOS ABS: 0.2 10*3/uL (ref 0.0–0.5)
EOS%: 4.1 % (ref 0.0–7.0)
HCT: 40.1 % (ref 34.8–46.6)
HGB: 13.5 g/dL (ref 11.6–15.9)
LYMPH%: 24 % (ref 14.0–49.7)
MCH: 32.5 pg (ref 25.1–34.0)
MCHC: 33.7 g/dL (ref 31.5–36.0)
MCV: 96.4 fL (ref 79.5–101.0)
MONO#: 0.4 10*3/uL (ref 0.1–0.9)
MONO%: 8 % (ref 0.0–14.0)
NEUT#: 3.4 10*3/uL (ref 1.5–6.5)
NEUT%: 63.7 % (ref 38.4–76.8)
PLATELETS: 190 10*3/uL (ref 145–400)
RBC: 4.16 10*6/uL (ref 3.70–5.45)
RDW: 12.9 % (ref 11.2–14.5)
WBC: 5.4 10*3/uL (ref 3.9–10.3)
lymph#: 1.3 10*3/uL (ref 0.9–3.3)

## 2016-07-30 LAB — KAPPA/LAMBDA LIGHT CHAINS
Ig Kappa Free Light Chain: 27.9 mg/L — ABNORMAL HIGH (ref 3.3–19.4)
Ig Lambda Free Light Chain: 18.4 mg/L (ref 5.7–26.3)
KAPPA/LAMBDA FLC RATIO: 1.52 (ref 0.26–1.65)

## 2016-07-30 LAB — BETA 2 MICROGLOBULIN, SERUM: BETA 2: 2.8 mg/L — AB (ref 0.6–2.4)

## 2016-08-06 ENCOUNTER — Telehealth: Payer: Self-pay | Admitting: Oncology

## 2016-08-06 ENCOUNTER — Ambulatory Visit (HOSPITAL_BASED_OUTPATIENT_CLINIC_OR_DEPARTMENT_OTHER): Payer: Medicare Other | Admitting: Oncology

## 2016-08-06 VITALS — BP 104/88 | HR 63 | Temp 98.6°F | Resp 18 | Ht 62.5 in | Wt 150.0 lb

## 2016-08-06 DIAGNOSIS — M810 Age-related osteoporosis without current pathological fracture: Secondary | ICD-10-CM

## 2016-08-06 DIAGNOSIS — E859 Amyloidosis, unspecified: Secondary | ICD-10-CM

## 2016-08-06 NOTE — Telephone Encounter (Signed)
Gave patients avs report and appointments for April and May 2019. Per patient request lab/fu schedule a week apart.

## 2016-08-06 NOTE — Progress Notes (Signed)
  [place] OFFICE PROGRESS NOTE   Diagnosis: Amyloidosis  INTERVAL HISTORY:   Kelly Robles returns as scheduled. She feels well. No new skin lesions. She had a recent upper respiratory infection.  Objective:  Vital signs in last 24 hours:  Blood pressure 104/88, pulse 63, temperature 98.6 F (37 C), temperature source Oral, resp. rate 18, height 5' 2.5" (1.588 m), weight 150 lb (68 kg), SpO2 100 %.    HEENT: Neck without mass Lymphatics: Pea-sized left scalene node. No other cervical, supraclavicular, axillary, or inguinal nodes Resp: Lungs clear bilaterally Cardio: Regular rate and rhythm GI: No hepatosplenomegaly Vascular: No leg edema Skin: Nodule 13 cm purplish lesion at the right pretibial region. No other suspicious skin lesions. Biopsy sites at the left kidney without evidence of recurrent tumor     Lab Results:  Lab Results  Component Value Date   WBC 5.4 07/29/2016   HGB 13.5 07/29/2016   HCT 40.1 07/29/2016   MCV 96.4 07/29/2016   PLT 190 07/29/2016   NEUTROABS 3.4 07/29/2016    CMP     Component Value Date/Time   NA 143 07/29/2016 1025   K 4.1 07/29/2016 1025   CL 104 02/16/2013 1142   CO2 25 07/29/2016 1025   GLUCOSE 83 07/29/2016 1025   BUN 10.5 07/29/2016 1025   CREATININE 0.8 07/29/2016 1025   CALCIUM 9.5 07/29/2016 1025   PROT 7.5 02/16/2013 1142   ALBUMIN 3.5 02/16/2013 1142   AST 22 02/16/2013 1142   ALT 14 02/16/2013 1142   ALKPHOS 64 02/16/2013 1142   BILITOT 0.7 02/16/2013 1142   GFRNONAA 79.75 11/09/2009 1123   GFRAA 93 11/25/2007 1015   Free kappa light chains 27.9 on 07/29/2016 Beta-2 microglobulin  2.8 on 07/29/2016 Medications: I have reviewed the patient's current medications.  Assessment/Plan: 1.Bilateral low leg cutaneous lesions, status post a biopsy of a left leg lesion on 01/03/2014 confirming amyloid with a plasma cell infiltrate   Mild elevation of the serum free kappa light chains   Mild elevation  of the beta-2 microglobulin   Bone marrow biopsy 02/20/2014 , 7 percent plasma cells (polyclonal) , negative Congo red stain 2. Discoid lupus  3. Sjogren's syndrome  4. Hepatitis B surface antibody positive July 2015-likely related to vaccination, hepatitis B surface antigen negative 02/03/2014  5. Osteoporosis   Disposition:  She appears unchanged. There is no clinical evidence of progressive amyloidosis or development of a lymphoproliferative disorder. She will return for an office and lab visit in 9 months. She will contact us in the interim as needed.  15 minutes were spent with the patient today. The majority of the time was used for counseling and coordination of care.  Donneta Romberg, MD  08/06/2016  11:21 AM

## 2016-08-07 ENCOUNTER — Ambulatory Visit: Payer: Medicare Other | Admitting: Oncology

## 2016-08-07 ENCOUNTER — Other Ambulatory Visit: Payer: Medicare Other

## 2016-08-11 ENCOUNTER — Ambulatory Visit: Payer: Medicare Other | Admitting: Oncology

## 2016-10-19 IMAGING — CR DG BONE SURVEY MET
10 series · 10 of 10 positions shown · non-contrast
Comparison: None.

CLINICAL DATA: Amyloid.  Evaluate for myeloma.

EXAM:
METASTATIC BONE SURVEY

[w chest pa]
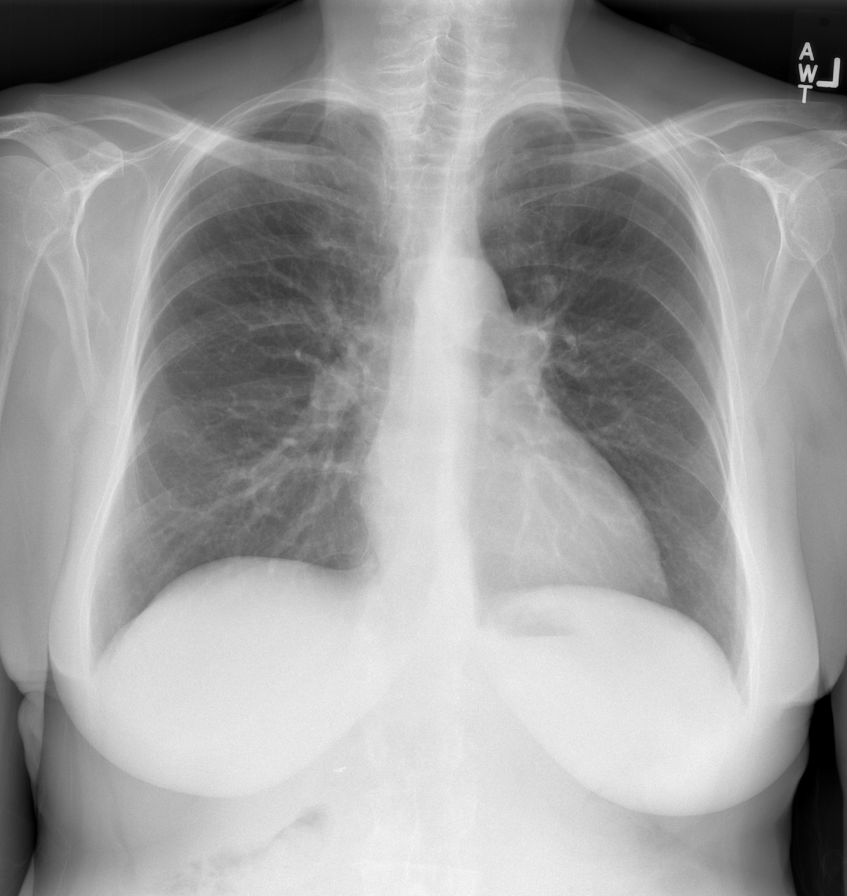

[w c-spine lat]
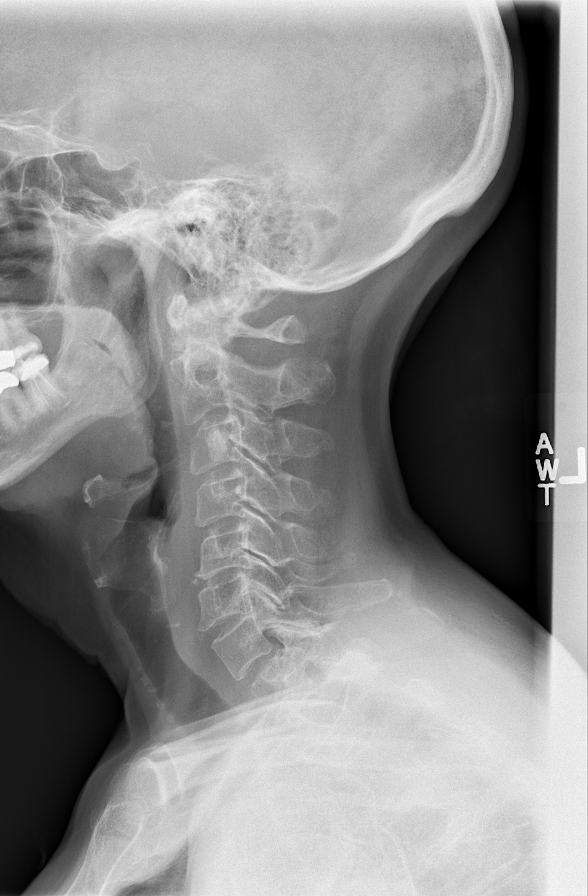

[w skull lat *]
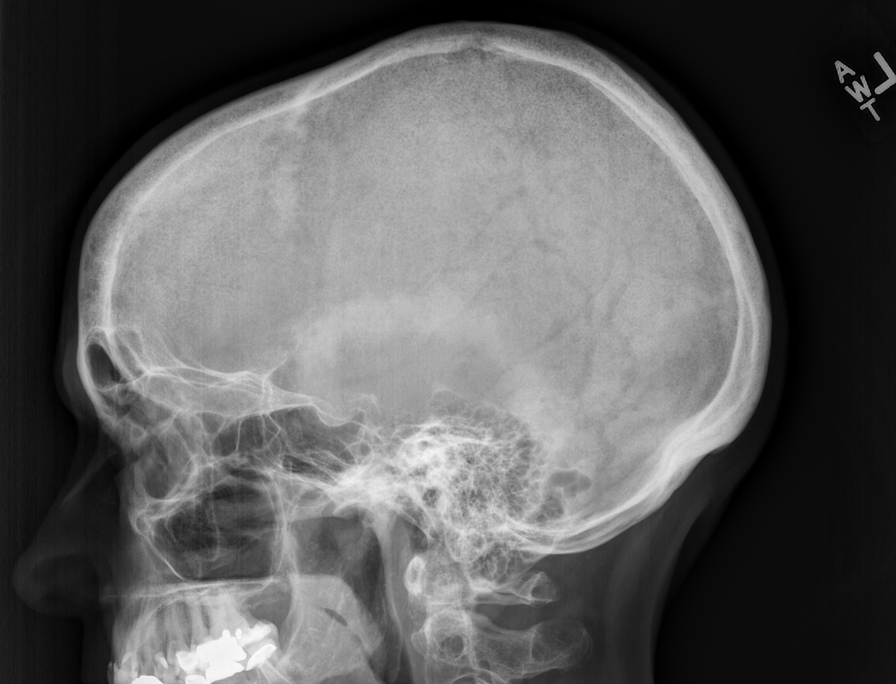

[x forearm ap left]
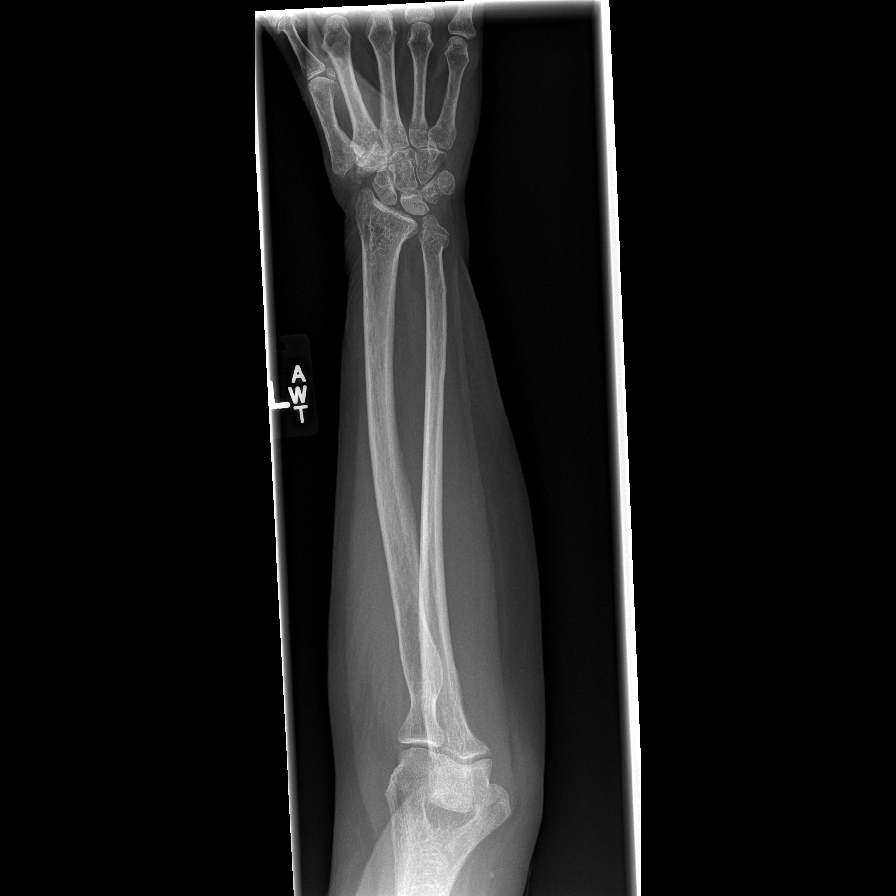

[x forearm ap right]
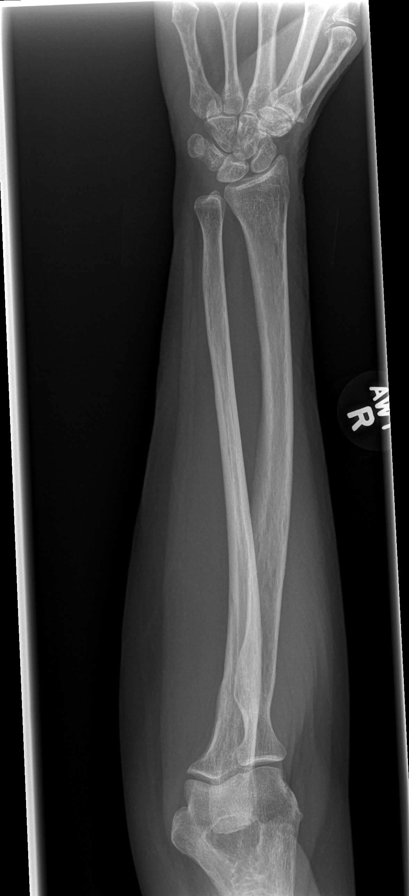

[t c-spine a.p.]
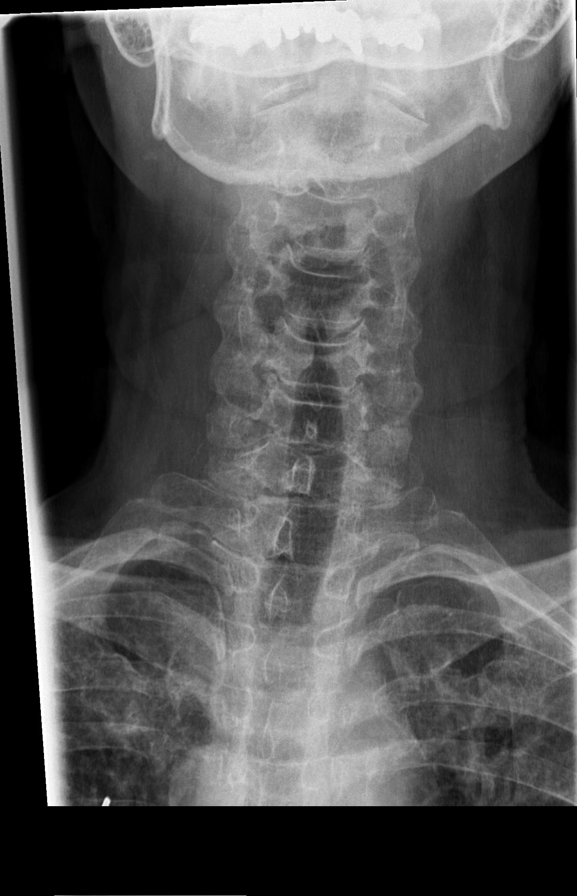

[t t-spine a.p.]
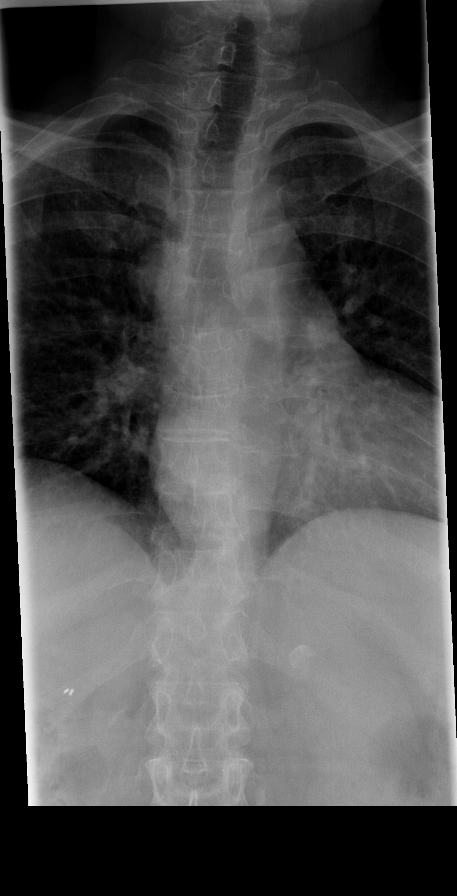

[t l-spine a.p.]
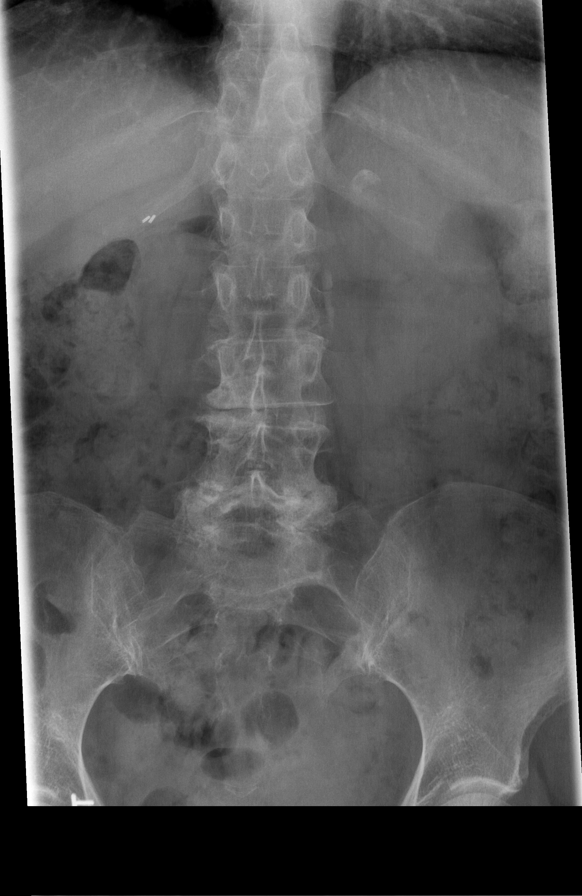

[t pelvis a.p.]
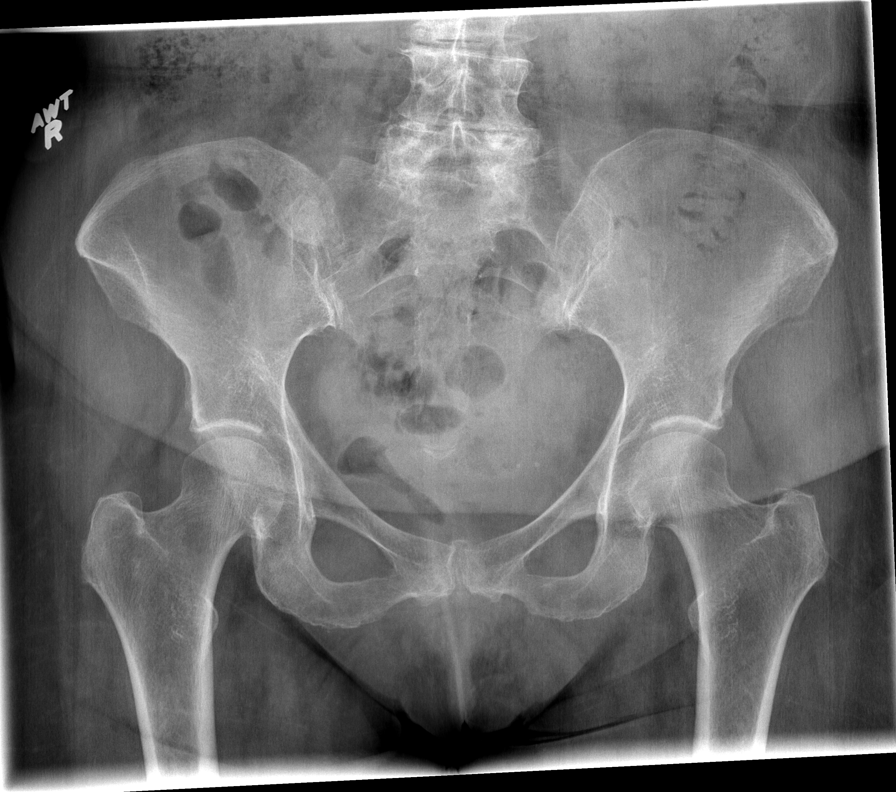

[t femur with hip  ap left]
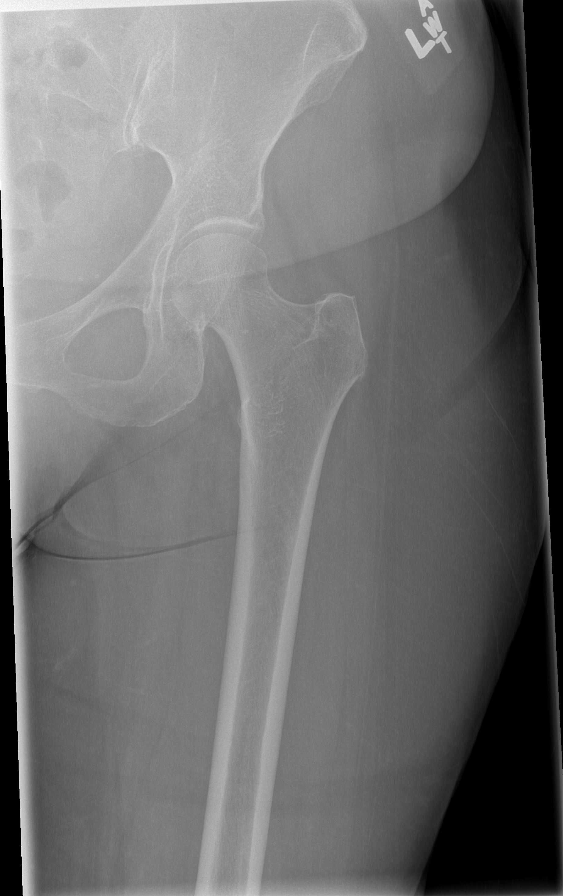

[10 of 10 positions shown; findings below may reference images not displayed]

FINDINGS: No lytic or blastic lesions are noted to suggest myeloma or
metastatic disease. Senescent changes are noted including cervical
thoracic and thoracolumbar prominent degenerative change. L5-S1
prominent anterolisthesis noted.
IMPRESSION: No focal abnormalities noted to suggest metastatic disease or
myeloma.

## 2017-05-12 ENCOUNTER — Inpatient Hospital Stay: Payer: Medicare Other | Attending: Oncology

## 2017-05-12 DIAGNOSIS — C541 Malignant neoplasm of endometrium: Secondary | ICD-10-CM | POA: Insufficient documentation

## 2017-05-12 DIAGNOSIS — E859 Amyloidosis, unspecified: Secondary | ICD-10-CM

## 2017-05-12 LAB — CBC WITH DIFFERENTIAL/PLATELET
BASOS ABS: 0 10*3/uL (ref 0.0–0.1)
BASOS PCT: 1 %
EOS ABS: 0.1 10*3/uL (ref 0.0–0.5)
Eosinophils Relative: 2 %
HEMATOCRIT: 38.5 % (ref 34.8–46.6)
HEMOGLOBIN: 13.1 g/dL (ref 11.6–15.9)
Lymphocytes Relative: 25 %
Lymphs Abs: 1.3 10*3/uL (ref 0.9–3.3)
MCH: 32.2 pg (ref 25.1–34.0)
MCHC: 33.9 g/dL (ref 31.5–36.0)
MCV: 94.9 fL (ref 79.5–101.0)
MONO ABS: 0.3 10*3/uL (ref 0.1–0.9)
Monocytes Relative: 7 %
NEUTROS ABS: 3.3 10*3/uL (ref 1.5–6.5)
NEUTROS PCT: 65 %
Platelets: 188 10*3/uL (ref 145–400)
RBC: 4.06 MIL/uL (ref 3.70–5.45)
RDW: 12.8 % (ref 11.2–14.5)
WBC: 5.1 10*3/uL (ref 3.9–10.3)

## 2017-05-12 LAB — BASIC METABOLIC PANEL
Anion gap: 7 (ref 3–11)
BUN: 10 mg/dL (ref 7–26)
CO2: 24 mmol/L (ref 22–29)
Calcium: 9.3 mg/dL (ref 8.4–10.4)
Chloride: 108 mmol/L (ref 98–109)
Creatinine, Ser: 0.74 mg/dL (ref 0.60–1.10)
GFR calc Af Amer: >60 mL/min (ref >60)
GLUCOSE: 80 mg/dL (ref 70–140)
POTASSIUM: 4 mmol/L (ref 3.5–5.1)
SODIUM: 139 mmol/L (ref 136–145)

## 2017-05-13 LAB — KAPPA/LAMBDA LIGHT CHAINS
KAPPA FREE LGHT CHN: 31.6 mg/L — AB (ref 3.3–19.4)
Kappa, lambda light chain ratio: 1.95 — ABNORMAL HIGH (ref 0.26–1.65)
Lambda free light chains: 16.2 mg/L (ref 5.7–26.3)

## 2017-05-13 LAB — BETA 2 MICROGLOBULIN, SERUM: BETA 2 MICROGLOBULIN: 2 mg/L (ref 0.6–2.4)

## 2017-05-14 LAB — IMMUNOFIXATION ELECTROPHORESIS
IGG (IMMUNOGLOBIN G), SERUM: 1685 mg/dL — AB (ref 700–1600)
IgA: 367 mg/dL (ref 64–422)
IgM (Immunoglobulin M), Srm: 228 mg/dL — ABNORMAL HIGH (ref 26–217)
Total Protein ELP: 7.3 g/dL (ref 6.0–8.5)

## 2017-05-21 ENCOUNTER — Telehealth: Payer: Self-pay | Admitting: Oncology

## 2017-05-21 ENCOUNTER — Inpatient Hospital Stay: Payer: Medicare Other | Attending: Oncology | Admitting: Oncology

## 2017-05-21 VITALS — BP 116/67 | HR 61 | Temp 98.5°F | Resp 17 | Ht 62.5 in | Wt 149.4 lb

## 2017-05-21 DIAGNOSIS — M810 Age-related osteoporosis without current pathological fracture: Secondary | ICD-10-CM | POA: Insufficient documentation

## 2017-05-21 DIAGNOSIS — L930 Discoid lupus erythematosus: Secondary | ICD-10-CM | POA: Insufficient documentation

## 2017-05-21 DIAGNOSIS — E859 Amyloidosis, unspecified: Secondary | ICD-10-CM

## 2017-05-21 DIAGNOSIS — M3500 Sicca syndrome, unspecified: Secondary | ICD-10-CM | POA: Insufficient documentation

## 2017-05-21 NOTE — Progress Notes (Signed)
[facility] OFFICE PROGRESS NOTE   Diagnosis: Amyloidosis  INTERVAL HISTORY:   Kelly Robles returns as scheduled.  She has noted discomfort at the left lower leg for the past week.  She has noted a "knot "medial to a biopsy site at the left tibia.  No new skin lesions.  Good appetite.  Objective:  Vital signs in last 24 hours:  Blood pressure 116/67, pulse 61, temperature 98.5 F (36.9 C), temperature source Oral, resp. rate 17, height 5' 2.5" (1.588 m), weight 149 lb 6.4 oz (67.8 kg), SpO2 99 %.    HEENT: Neck without mass Lymphatics: No cervical, supraclavicular, axillary, or inguinal nodes Resp: Lungs clear bilaterally Cardio: Regular rate and rhythm GI: No hepatosplenomegaly, no mass, nontender Vascular: No leg edema.  Bilateral lower leg and foot varicosities.  At the medial aspect of the left mid calf there is a firm varix that extends proximally for a few centimeters.  No erythema..  The area of induration does not extend above the knee.  Skin: Purpleish nodular lesion at the right pretibial region appears unchanged.  Hyperpigmented biopsy sites at the left tibia.   Lab Results:  Lab Results  Component Value Date   WBC 5.1 05/12/2017   HGB 13.1 05/12/2017   HCT 38.5 05/12/2017   MCV 94.9 05/12/2017   PLT 188 05/12/2017   NEUTROABS 3.3 05/12/2017    CMP     Component Value Date/Time   NA 139 05/12/2017 0958   NA 143 07/29/2016 1025   K 4.0 05/12/2017 0958   K 4.1 07/29/2016 1025   CL 108 05/12/2017 0958   CO2 24 05/12/2017 0958   CO2 25 07/29/2016 1025   GLUCOSE 80 05/12/2017 0958   GLUCOSE 83 07/29/2016 1025   BUN 10 05/12/2017 0958   BUN 10.5 07/29/2016 1025   CREATININE 0.74 05/12/2017 0958   CREATININE 0.8 07/29/2016 1025   CALCIUM 9.3 05/12/2017 0958   CALCIUM 9.5 07/29/2016 1025   PROT 7.5 02/16/2013 1142   ALBUMIN 3.5 02/16/2013 1142   AST 22 02/16/2013 1142   ALT 14 02/16/2013 1142   ALKPHOS 64 02/16/2013 1142   BILITOT 0.7  02/16/2013 1142   GFRNONAA >60 05/12/2017 0958   GFRAA >60 05/12/2017 0958   IgG 1685, beta-2 microglobulin 2.0, kappa free light chains 31.6, lambda free light chain 16.2  Medications: I have reviewed the patient's current medications.   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 Discoid lupus  3. Sjogren's syndrome  4. Hepatitis B surface antibody positive July 2015-likely related to vaccination, hepatitis B surface antigen negative 02/03/2014  5. Osteoporosis   Disposition: [name] appears well.  There is no clinical or laboratory evidence for progression of amyloidosis or development of another lymphoproliferative disorder.  She has developed discomfort and tenderness associated with varicosities at the left lower leg.  She most likely has superficial thrombophlebitis.  She will begin warm compresses and a 1 week course of aspirin.  I have a low clinical suspicion for a deep vein thrombosis, but we will check a Doppler to rule out a DVT.  She will contact us if the leg symptoms do not improve over the  next week.  She will return for a scheduled lab and office visit in 9 months.  I am available to see her sooner as needed.  Betsy Coder, MD  05/21/2017  1:45 PM

## 2017-05-21 NOTE — Telephone Encounter (Signed)
Appointments scheduled AVS/Calendar printed per 5/9 los °

## 2017-05-22 ENCOUNTER — Ambulatory Visit (HOSPITAL_COMMUNITY): Payer: Medicare Other

## 2017-05-22 ENCOUNTER — Ambulatory Visit (HOSPITAL_COMMUNITY)
Admission: RE | Admit: 2017-05-22 | Discharge: 2017-05-22 | Disposition: A | Discharge status: Home / Self Care | Payer: Medicare Other | Source: Ambulatory Visit | Attending: Oncology | Admitting: Oncology

## 2017-05-22 DIAGNOSIS — M79605 Pain in left leg: Secondary | ICD-10-CM | POA: Insufficient documentation

## 2017-05-22 DIAGNOSIS — M7989 Other specified soft tissue disorders: Secondary | ICD-10-CM | POA: Diagnosis not present

## 2017-05-22 DIAGNOSIS — E859 Amyloidosis, unspecified: Secondary | ICD-10-CM | POA: Insufficient documentation

## 2017-05-22 NOTE — Progress Notes (Signed)
Preliminary notes--Left lower extremity venous duplex exam completed. Negative for DVT.  Result called Dr. Learta Codding, patient was instructed to go back home.  Hongying Kita Neace (RDMS RVT) 05/22/17 1:43 PM

## 2018-01-15 ENCOUNTER — Inpatient Hospital Stay: Payer: Medicare Other | Attending: Oncology

## 2018-01-15 DIAGNOSIS — M3500 Sicca syndrome, unspecified: Secondary | ICD-10-CM | POA: Diagnosis not present

## 2018-01-15 DIAGNOSIS — E859 Amyloidosis, unspecified: Secondary | ICD-10-CM | POA: Insufficient documentation

## 2018-01-15 DIAGNOSIS — L930 Discoid lupus erythematosus: Secondary | ICD-10-CM | POA: Diagnosis not present

## 2018-01-15 DIAGNOSIS — I8393 Asymptomatic varicose veins of bilateral lower extremities: Secondary | ICD-10-CM | POA: Insufficient documentation

## 2018-01-15 LAB — CBC WITH DIFFERENTIAL (CANCER CENTER ONLY)
Abs Immature Granulocytes: 0.01 10*3/uL (ref 0.00–0.07)
BASOS ABS: 0 10*3/uL (ref 0.0–0.1)
Basophils Relative: 0 %
Eosinophils Absolute: 0.1 10*3/uL (ref 0.0–0.5)
Eosinophils Relative: 2 %
HCT: 38.7 % (ref 36.0–46.0)
Hemoglobin: 12.9 g/dL (ref 12.0–15.0)
Immature Granulocytes: 0 %
Lymphocytes Relative: 27 %
Lymphs Abs: 1.4 10*3/uL (ref 0.7–4.0)
MCH: 32.3 pg (ref 26.0–34.0)
MCHC: 33.3 g/dL (ref 30.0–36.0)
MCV: 96.8 fL (ref 80.0–100.0)
Monocytes Absolute: 0.4 10*3/uL (ref 0.1–1.0)
Monocytes Relative: 7 %
NRBC: 0 % (ref 0.0–0.2)
Neutro Abs: 3.4 10*3/uL (ref 1.7–7.7)
Neutrophils Relative %: 64 %
Platelet Count: 205 10*3/uL (ref 150–400)
RBC: 4 MIL/uL (ref 3.87–5.11)
RDW: 12.4 % (ref 11.5–15.5)
WBC Count: 5.4 10*3/uL (ref 4.0–10.5)

## 2018-01-15 LAB — BASIC METABOLIC PANEL - CANCER CENTER ONLY
Anion gap: 8 (ref 5–15)
BUN: 9 mg/dL (ref 8–23)
CHLORIDE: 107 mmol/L (ref 98–111)
CO2: 24 mmol/L (ref 22–32)
CREATININE: 0.76 mg/dL (ref 0.44–1.00)
Calcium: 9.3 mg/dL (ref 8.9–10.3)
GFR, Est AFR Am: >60 mL/min (ref >60)
GFR, Estimated: >60 mL/min (ref >60)
Glucose, Bld: 86 mg/dL (ref 70–99)
Potassium: 3.9 mmol/L (ref 3.5–5.1)
Sodium: 139 mmol/L (ref 135–145)

## 2018-01-16 LAB — IGG, IGA, IGM
IgA: 385 mg/dL (ref 64–422)
IgG (Immunoglobin G), Serum: 1716 mg/dL — ABNORMAL HIGH (ref 700–1600)
IgM (Immunoglobulin M), Srm: 240 mg/dL — ABNORMAL HIGH (ref 26–217)

## 2018-01-18 LAB — PROTEIN ELECTROPHORESIS, SERUM, WITH REFLEX
A/G Ratio: 1.1 (ref 0.7–1.7)
Albumin ELP: 3.7 g/dL (ref 2.9–4.4)
Alpha-1-Globulin: 0.2 g/dL (ref 0.0–0.4)
Alpha-2-Globulin: 0.7 g/dL (ref 0.4–1.0)
Beta Globulin: 1 g/dL (ref 0.7–1.3)
Gamma Globulin: 1.6 g/dL (ref 0.4–1.8)
Globulin, Total: 3.5 g/dL (ref 2.2–3.9)
Total Protein ELP: 7.2 g/dL (ref 6.0–8.5)

## 2018-01-18 LAB — KAPPA/LAMBDA LIGHT CHAINS
Kappa free light chain: 29.5 mg/L — ABNORMAL HIGH (ref 3.3–19.4)
Kappa, lambda light chain ratio: 1.68 — ABNORMAL HIGH (ref 0.26–1.65)
Lambda free light chains: 17.6 mg/L (ref 5.7–26.3)

## 2018-01-29 ENCOUNTER — Inpatient Hospital Stay: Payer: Medicare Other | Admitting: Oncology

## 2018-01-29 ENCOUNTER — Telehealth: Payer: Self-pay

## 2018-01-29 VITALS — BP 126/63 | HR 99 | Temp 98.0°F | Resp 18 | Wt 147.6 lb

## 2018-01-29 DIAGNOSIS — M3500 Sicca syndrome, unspecified: Secondary | ICD-10-CM

## 2018-01-29 DIAGNOSIS — L930 Discoid lupus erythematosus: Secondary | ICD-10-CM

## 2018-01-29 DIAGNOSIS — I8393 Asymptomatic varicose veins of bilateral lower extremities: Secondary | ICD-10-CM | POA: Diagnosis not present

## 2018-01-29 DIAGNOSIS — E859 Amyloidosis, unspecified: Secondary | ICD-10-CM

## 2018-01-29 NOTE — Progress Notes (Signed)
[facility] OFFICE PROGRESS NOTE   Diagnosis: Amyloidosis  INTERVAL HISTORY:   Kelly Robles  returns for a scheduled visit.` She feels well.  Good appetite and energy level.  She has intermittent tenderness at the bilateral lower legs and area of varicosities.  The medial right lower leg is currently tender.  No leg swelling.  She plans to relocate to [facility] and will transfer all of her medical care there.  A left lower extremity Doppler was negative on 05/22/2017.  Objective:  Vital signs in last 24 hours:  Blood pressure 126/63, pulse 99, temperature 98 F (36.7 C), temperature source Oral, resp. rate 18, weight 147 lb 9.6 oz (67 kg), SpO2 100 %.    HEENT: Neck without mass Lymphatics: No cervical, supraclavicular, axillary, or inguinal nodes Resp: Clear bilaterally Cardio: Regular rate and rhythm GI: No hepatosplenomegaly, no mass, nontender Vascular: No leg edema, bilateral lower leg and foot varicosities.  At the medial left lower leg just above the ankle there is an area of mild tenderness and subcutaneous induration measuring 2-3 cm.  Faint overlying erythema.  No discrete palpable cord. Skin: Purplish 1.5 cm lesion at the right pretibial region-unchanged, hyperpigmented biopsy sites at the left tibia.  No apparent new skin lesions.    Lab Results:  Lab Results  Component Value Date   WBC 5.4 01/15/2018   HGB 12.9 01/15/2018   HCT 38.7 01/15/2018   MCV 96.8 01/15/2018   PLT 205 01/15/2018   NEUTROABS 3.4 01/15/2018    CMP  Lab Results  Component Value Date   NA 139 01/15/2018   K 3.9 01/15/2018   CL 107 01/15/2018   CO2 24 01/15/2018   GLUCOSE 86 01/15/2018   BUN 9 01/15/2018   CREATININE 0.76 01/15/2018   CALCIUM 9.3 01/15/2018   PROT 7.5 02/16/2013   ALBUMIN 3.5 02/16/2013   AST 22 02/16/2013   ALT 14 02/16/2013   ALKPHOS 64 02/16/2013   BILITOT 0.7 02/16/2013   GFRNONAA >60 01/15/2018   GFRAA >60 01/15/2018   No  serum M spike, IgG 1716, IgM 240, kappa free light chain 29.5, lambda free light chain 17.6  Medications: I have reviewed the patient's current medications.   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 Discoid lupus  3. Sjogren's syndrome  4. Hepatitis B surface antibody positive July 2015-likely related to vaccination, hepatitis B surface antigen negative 02/03/2014  5. Osteoporosis 6.  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  There is no evidence of progressive cutaneous amyloidosis.  She has not developed evidence of systemic amyloidosis or myeloma.  She has bilateral lower extremity varicosities.  These are intermittently painful.  She may have an area of superficial phlebitis at the medial right lower leg.  She will try warm compresses and take aspirin daily.  She will seek medical attention for leg swelling or if this area does not improve.  Kelly Robles will relocate to [facility] within the next few weeks.  She will establish care with a primary physician and oncologist there.  I am available to see her in  the future as needed.   Betsy Coder, MD  01/29/2018  11:24 AM

## 2018-01-29 NOTE — Telephone Encounter (Signed)
F/u TBA. Per 1/17 no los

## 2018-02-15 ENCOUNTER — Other Ambulatory Visit: Payer: Medicare Other

## 2018-02-26 ENCOUNTER — Ambulatory Visit: Payer: Medicare Other | Admitting: Oncology
